# Patient Record
Sex: Male | Born: 1945
Health system: Southern US, Community
[De-identification: ages and names within clinical notes are randomized; demographics above are authoritative.]

## PROBLEM LIST (undated history)

## (undated) DIAGNOSIS — J449 Chronic obstructive pulmonary disease, unspecified: Secondary | ICD-10-CM

## (undated) DIAGNOSIS — K5010 Crohn's disease of large intestine without complications: Secondary | ICD-10-CM

## (undated) DIAGNOSIS — I4891 Unspecified atrial fibrillation: Secondary | ICD-10-CM

## (undated) DIAGNOSIS — I10 Essential (primary) hypertension: Secondary | ICD-10-CM

## (undated) DIAGNOSIS — G4733 Obstructive sleep apnea (adult) (pediatric): Secondary | ICD-10-CM

## (undated) DIAGNOSIS — E119 Type 2 diabetes mellitus without complications: Secondary | ICD-10-CM

## (undated) DIAGNOSIS — C3490 Malignant neoplasm of unspecified part of unspecified bronchus or lung: Secondary | ICD-10-CM

## (undated) HISTORY — PX: HEMORRHOID SURGERY: SHX153

---

## 2016-10-30 DIAGNOSIS — I10 Essential (primary) hypertension: Secondary | ICD-10-CM | POA: Diagnosis not present

## 2016-10-30 DIAGNOSIS — J9621 Acute and chronic respiratory failure with hypoxia: Secondary | ICD-10-CM | POA: Diagnosis not present

## 2016-10-30 DIAGNOSIS — J449 Chronic obstructive pulmonary disease, unspecified: Secondary | ICD-10-CM | POA: Diagnosis not present

## 2016-10-30 DIAGNOSIS — Z9889 Other specified postprocedural states: Secondary | ICD-10-CM | POA: Diagnosis not present

## 2016-10-30 DIAGNOSIS — C3490 Malignant neoplasm of unspecified part of unspecified bronchus or lung: Secondary | ICD-10-CM | POA: Diagnosis not present

## 2016-10-30 DIAGNOSIS — E875 Hyperkalemia: Secondary | ICD-10-CM | POA: Diagnosis not present

## 2016-10-30 DIAGNOSIS — I4891 Unspecified atrial fibrillation: Secondary | ICD-10-CM | POA: Diagnosis not present

## 2016-10-30 DIAGNOSIS — D72829 Elevated white blood cell count, unspecified: Secondary | ICD-10-CM | POA: Diagnosis not present

## 2016-10-31 DIAGNOSIS — D72829 Elevated white blood cell count, unspecified: Secondary | ICD-10-CM | POA: Diagnosis not present

## 2016-10-31 DIAGNOSIS — Z9889 Other specified postprocedural states: Secondary | ICD-10-CM | POA: Diagnosis not present

## 2016-10-31 DIAGNOSIS — I10 Essential (primary) hypertension: Secondary | ICD-10-CM | POA: Diagnosis not present

## 2016-10-31 DIAGNOSIS — I4891 Unspecified atrial fibrillation: Secondary | ICD-10-CM | POA: Diagnosis not present

## 2016-10-31 DIAGNOSIS — J449 Chronic obstructive pulmonary disease, unspecified: Secondary | ICD-10-CM | POA: Diagnosis not present

## 2016-10-31 DIAGNOSIS — J9621 Acute and chronic respiratory failure with hypoxia: Secondary | ICD-10-CM | POA: Diagnosis not present

## 2016-10-31 DIAGNOSIS — C3490 Malignant neoplasm of unspecified part of unspecified bronchus or lung: Secondary | ICD-10-CM | POA: Diagnosis not present

## 2016-10-31 DIAGNOSIS — E875 Hyperkalemia: Secondary | ICD-10-CM | POA: Diagnosis not present

## 2017-09-27 DIAGNOSIS — G4700 Insomnia, unspecified: Secondary | ICD-10-CM | POA: Diagnosis not present

## 2017-09-27 DIAGNOSIS — I10 Essential (primary) hypertension: Secondary | ICD-10-CM | POA: Diagnosis not present

## 2017-09-30 DIAGNOSIS — G4700 Insomnia, unspecified: Secondary | ICD-10-CM | POA: Diagnosis not present

## 2017-09-30 DIAGNOSIS — Z6836 Body mass index (BMI) 36.0-36.9, adult: Secondary | ICD-10-CM | POA: Diagnosis not present

## 2017-10-07 DIAGNOSIS — Z6834 Body mass index (BMI) 34.0-34.9, adult: Secondary | ICD-10-CM | POA: Diagnosis not present

## 2017-10-07 DIAGNOSIS — Z7189 Other specified counseling: Secondary | ICD-10-CM | POA: Diagnosis not present

## 2017-12-17 DIAGNOSIS — D2310 Other benign neoplasm of skin of unspecified eyelid, including canthus: Secondary | ICD-10-CM | POA: Diagnosis not present

## 2018-01-22 DIAGNOSIS — Z6835 Body mass index (BMI) 35.0-35.9, adult: Secondary | ICD-10-CM | POA: Diagnosis not present

## 2018-01-22 DIAGNOSIS — G4700 Insomnia, unspecified: Secondary | ICD-10-CM | POA: Diagnosis not present

## 2018-01-22 DIAGNOSIS — K644 Residual hemorrhoidal skin tags: Secondary | ICD-10-CM | POA: Diagnosis not present

## 2018-03-25 DIAGNOSIS — Z923 Personal history of irradiation: Secondary | ICD-10-CM | POA: Diagnosis not present

## 2018-03-25 DIAGNOSIS — Z1211 Encounter for screening for malignant neoplasm of colon: Secondary | ICD-10-CM | POA: Diagnosis not present

## 2018-03-25 DIAGNOSIS — D123 Benign neoplasm of transverse colon: Secondary | ICD-10-CM | POA: Diagnosis not present

## 2018-03-25 DIAGNOSIS — K529 Noninfective gastroenteritis and colitis, unspecified: Secondary | ICD-10-CM | POA: Diagnosis not present

## 2018-03-25 DIAGNOSIS — Z7901 Long term (current) use of anticoagulants: Secondary | ICD-10-CM | POA: Diagnosis not present

## 2018-03-25 DIAGNOSIS — K6289 Other specified diseases of anus and rectum: Secondary | ICD-10-CM | POA: Diagnosis not present

## 2018-03-25 DIAGNOSIS — K210 Gastro-esophageal reflux disease with esophagitis: Secondary | ICD-10-CM | POA: Diagnosis not present

## 2018-03-25 DIAGNOSIS — I4891 Unspecified atrial fibrillation: Secondary | ICD-10-CM | POA: Diagnosis not present

## 2018-03-25 DIAGNOSIS — K222 Esophageal obstruction: Secondary | ICD-10-CM | POA: Diagnosis not present

## 2018-03-25 DIAGNOSIS — K449 Diaphragmatic hernia without obstruction or gangrene: Secondary | ICD-10-CM | POA: Diagnosis not present

## 2018-03-25 DIAGNOSIS — D124 Benign neoplasm of descending colon: Secondary | ICD-10-CM | POA: Diagnosis not present

## 2018-03-25 DIAGNOSIS — Z7951 Long term (current) use of inhaled steroids: Secondary | ICD-10-CM | POA: Diagnosis not present

## 2018-03-25 DIAGNOSIS — D125 Benign neoplasm of sigmoid colon: Secondary | ICD-10-CM | POA: Diagnosis not present

## 2018-03-25 DIAGNOSIS — Z85118 Personal history of other malignant neoplasm of bronchus and lung: Secondary | ICD-10-CM | POA: Diagnosis not present

## 2018-03-25 DIAGNOSIS — K2970 Gastritis, unspecified, without bleeding: Secondary | ICD-10-CM | POA: Diagnosis not present

## 2018-03-25 DIAGNOSIS — J449 Chronic obstructive pulmonary disease, unspecified: Secondary | ICD-10-CM | POA: Diagnosis not present

## 2018-03-25 DIAGNOSIS — K2950 Unspecified chronic gastritis without bleeding: Secondary | ICD-10-CM | POA: Diagnosis not present

## 2018-03-25 DIAGNOSIS — E1140 Type 2 diabetes mellitus with diabetic neuropathy, unspecified: Secondary | ICD-10-CM | POA: Diagnosis not present

## 2018-03-25 DIAGNOSIS — Z7982 Long term (current) use of aspirin: Secondary | ICD-10-CM | POA: Diagnosis not present

## 2018-03-25 DIAGNOSIS — K648 Other hemorrhoids: Secondary | ICD-10-CM | POA: Diagnosis not present

## 2018-03-25 DIAGNOSIS — K635 Polyp of colon: Secondary | ICD-10-CM | POA: Diagnosis not present

## 2018-03-25 DIAGNOSIS — K644 Residual hemorrhoidal skin tags: Secondary | ICD-10-CM | POA: Diagnosis not present

## 2018-03-25 DIAGNOSIS — Z79899 Other long term (current) drug therapy: Secondary | ICD-10-CM | POA: Diagnosis not present

## 2018-03-25 DIAGNOSIS — D122 Benign neoplasm of ascending colon: Secondary | ICD-10-CM | POA: Diagnosis not present

## 2018-04-08 DIAGNOSIS — J449 Chronic obstructive pulmonary disease, unspecified: Secondary | ICD-10-CM | POA: Diagnosis not present

## 2018-04-08 DIAGNOSIS — E119 Type 2 diabetes mellitus without complications: Secondary | ICD-10-CM | POA: Diagnosis not present

## 2018-04-08 DIAGNOSIS — I10 Essential (primary) hypertension: Secondary | ICD-10-CM | POA: Diagnosis not present

## 2018-04-08 DIAGNOSIS — K643 Fourth degree hemorrhoids: Secondary | ICD-10-CM | POA: Diagnosis not present

## 2018-04-11 DIAGNOSIS — Z01812 Encounter for preprocedural laboratory examination: Secondary | ICD-10-CM | POA: Diagnosis not present

## 2018-04-11 DIAGNOSIS — I4510 Unspecified right bundle-branch block: Secondary | ICD-10-CM | POA: Diagnosis not present

## 2018-04-11 DIAGNOSIS — K643 Fourth degree hemorrhoids: Secondary | ICD-10-CM | POA: Diagnosis not present

## 2018-04-11 DIAGNOSIS — I4519 Other right bundle-branch block: Secondary | ICD-10-CM | POA: Diagnosis not present

## 2018-04-11 DIAGNOSIS — I4891 Unspecified atrial fibrillation: Secondary | ICD-10-CM | POA: Diagnosis not present

## 2018-04-11 DIAGNOSIS — Z01810 Encounter for preprocedural cardiovascular examination: Secondary | ICD-10-CM | POA: Diagnosis not present

## 2018-04-14 DIAGNOSIS — K643 Fourth degree hemorrhoids: Secondary | ICD-10-CM | POA: Diagnosis not present

## 2018-04-14 DIAGNOSIS — K649 Unspecified hemorrhoids: Secondary | ICD-10-CM | POA: Diagnosis not present

## 2018-04-14 DIAGNOSIS — J449 Chronic obstructive pulmonary disease, unspecified: Secondary | ICD-10-CM | POA: Diagnosis not present

## 2018-04-14 DIAGNOSIS — Z9119 Patient's noncompliance with other medical treatment and regimen: Secondary | ICD-10-CM | POA: Diagnosis not present

## 2018-04-14 DIAGNOSIS — I4519 Other right bundle-branch block: Secondary | ICD-10-CM | POA: Diagnosis not present

## 2018-04-14 DIAGNOSIS — Z87891 Personal history of nicotine dependence: Secondary | ICD-10-CM | POA: Diagnosis not present

## 2018-04-14 DIAGNOSIS — I4891 Unspecified atrial fibrillation: Secondary | ICD-10-CM | POA: Diagnosis not present

## 2018-04-14 DIAGNOSIS — E119 Type 2 diabetes mellitus without complications: Secondary | ICD-10-CM | POA: Diagnosis not present

## 2018-04-14 DIAGNOSIS — I10 Essential (primary) hypertension: Secondary | ICD-10-CM | POA: Diagnosis not present

## 2018-04-14 DIAGNOSIS — K648 Other hemorrhoids: Secondary | ICD-10-CM | POA: Diagnosis not present

## 2018-04-14 DIAGNOSIS — G4733 Obstructive sleep apnea (adult) (pediatric): Secondary | ICD-10-CM | POA: Diagnosis not present

## 2018-04-14 DIAGNOSIS — F419 Anxiety disorder, unspecified: Secondary | ICD-10-CM | POA: Diagnosis not present

## 2018-07-18 DIAGNOSIS — G9009 Other idiopathic peripheral autonomic neuropathy: Secondary | ICD-10-CM | POA: Diagnosis not present

## 2018-10-02 DIAGNOSIS — H11002 Unspecified pterygium of left eye: Secondary | ICD-10-CM | POA: Diagnosis not present

## 2018-10-02 DIAGNOSIS — L820 Inflamed seborrheic keratosis: Secondary | ICD-10-CM | POA: Diagnosis not present

## 2018-10-02 DIAGNOSIS — D2339 Other benign neoplasm of skin of other parts of face: Secondary | ICD-10-CM | POA: Diagnosis not present

## 2018-10-02 DIAGNOSIS — D485 Neoplasm of uncertain behavior of skin: Secondary | ICD-10-CM | POA: Diagnosis not present

## 2019-04-02 ENCOUNTER — Inpatient Hospital Stay (HOSPITAL_COMMUNITY): Payer: No Typology Code available for payment source

## 2019-04-02 ENCOUNTER — Inpatient Hospital Stay (HOSPITAL_COMMUNITY)
Admission: EM | Admit: 2019-04-02 | Discharge: 2019-04-05 | DRG: 208 | Disposition: A | Discharge status: Home Health | Payer: No Typology Code available for payment source | Source: Other Acute Inpatient Hospital | Attending: Internal Medicine | Admitting: Internal Medicine

## 2019-04-02 ENCOUNTER — Encounter (HOSPITAL_COMMUNITY): Payer: Self-pay | Admitting: Internal Medicine

## 2019-04-02 DIAGNOSIS — I4891 Unspecified atrial fibrillation: Secondary | ICD-10-CM | POA: Diagnosis present

## 2019-04-02 DIAGNOSIS — Z833 Family history of diabetes mellitus: Secondary | ICD-10-CM

## 2019-04-02 DIAGNOSIS — Z7982 Long term (current) use of aspirin: Secondary | ICD-10-CM | POA: Diagnosis not present

## 2019-04-02 DIAGNOSIS — Z20828 Contact with and (suspected) exposure to other viral communicable diseases: Secondary | ICD-10-CM | POA: Diagnosis not present

## 2019-04-02 DIAGNOSIS — J449 Chronic obstructive pulmonary disease, unspecified: Secondary | ICD-10-CM | POA: Diagnosis present

## 2019-04-02 DIAGNOSIS — K5010 Crohn's disease of large intestine without complications: Secondary | ICD-10-CM | POA: Diagnosis not present

## 2019-04-02 DIAGNOSIS — F1021 Alcohol dependence, in remission: Secondary | ICD-10-CM | POA: Diagnosis present

## 2019-04-02 DIAGNOSIS — J441 Chronic obstructive pulmonary disease with (acute) exacerbation: Secondary | ICD-10-CM | POA: Diagnosis not present

## 2019-04-02 DIAGNOSIS — J9621 Acute and chronic respiratory failure with hypoxia: Principal | ICD-10-CM | POA: Diagnosis present

## 2019-04-02 DIAGNOSIS — J9622 Acute and chronic respiratory failure with hypercapnia: Secondary | ICD-10-CM | POA: Diagnosis not present

## 2019-04-02 DIAGNOSIS — Z87891 Personal history of nicotine dependence: Secondary | ICD-10-CM

## 2019-04-02 DIAGNOSIS — J9690 Respiratory failure, unspecified, unspecified whether with hypoxia or hypercapnia: Secondary | ICD-10-CM | POA: Diagnosis not present

## 2019-04-02 DIAGNOSIS — K50119 Crohn's disease of large intestine with unspecified complications: Secondary | ICD-10-CM | POA: Diagnosis not present

## 2019-04-02 DIAGNOSIS — Z8049 Family history of malignant neoplasm of other genital organs: Secondary | ICD-10-CM

## 2019-04-02 DIAGNOSIS — Z9221 Personal history of antineoplastic chemotherapy: Secondary | ICD-10-CM

## 2019-04-02 DIAGNOSIS — E662 Morbid (severe) obesity with alveolar hypoventilation: Secondary | ICD-10-CM | POA: Diagnosis not present

## 2019-04-02 DIAGNOSIS — Z9981 Dependence on supplemental oxygen: Secondary | ICD-10-CM | POA: Diagnosis not present

## 2019-04-02 DIAGNOSIS — T426X5A Adverse effect of other antiepileptic and sedative-hypnotic drugs, initial encounter: Secondary | ICD-10-CM | POA: Diagnosis present

## 2019-04-02 DIAGNOSIS — G9340 Encephalopathy, unspecified: Secondary | ICD-10-CM | POA: Diagnosis not present

## 2019-04-02 DIAGNOSIS — Z85118 Personal history of other malignant neoplasm of bronchus and lung: Secondary | ICD-10-CM

## 2019-04-02 DIAGNOSIS — J9611 Chronic respiratory failure with hypoxia: Secondary | ICD-10-CM

## 2019-04-02 DIAGNOSIS — Z923 Personal history of irradiation: Secondary | ICD-10-CM | POA: Diagnosis not present

## 2019-04-02 DIAGNOSIS — Z800 Family history of malignant neoplasm of digestive organs: Secondary | ICD-10-CM

## 2019-04-02 DIAGNOSIS — Z6835 Body mass index (BMI) 35.0-35.9, adult: Secondary | ICD-10-CM | POA: Diagnosis not present

## 2019-04-02 DIAGNOSIS — G4700 Insomnia, unspecified: Secondary | ICD-10-CM | POA: Diagnosis not present

## 2019-04-02 DIAGNOSIS — K219 Gastro-esophageal reflux disease without esophagitis: Secondary | ICD-10-CM | POA: Diagnosis not present

## 2019-04-02 DIAGNOSIS — J9601 Acute respiratory failure with hypoxia: Secondary | ICD-10-CM | POA: Diagnosis not present

## 2019-04-02 DIAGNOSIS — G92 Toxic encephalopathy: Secondary | ICD-10-CM | POA: Diagnosis not present

## 2019-04-02 DIAGNOSIS — I482 Chronic atrial fibrillation, unspecified: Secondary | ICD-10-CM | POA: Diagnosis not present

## 2019-04-02 HISTORY — DX: Chronic obstructive pulmonary disease, unspecified: J44.9

## 2019-04-02 HISTORY — DX: Obstructive sleep apnea (adult) (pediatric): G47.33

## 2019-04-02 LAB — PROCALCITONIN: Procalcitonin: <0.1 ng/mL

## 2019-04-02 LAB — COMPREHENSIVE METABOLIC PANEL
ALT: 22 U/L (ref 0–44)
AST: 23 U/L (ref 15–41)
Albumin: 3.4 g/dL — ABNORMAL LOW (ref 3.5–5.0)
Alkaline Phosphatase: 65 U/L (ref 38–126)
Anion gap: 10 (ref 5–15)
BUN: 19 mg/dL (ref 8–23)
CO2: 36 mmol/L — ABNORMAL HIGH (ref 22–32)
Calcium: 8.9 mg/dL (ref 8.9–10.3)
Chloride: 92 mmol/L — ABNORMAL LOW (ref 98–111)
Creatinine, Ser: 1.01 mg/dL (ref 0.61–1.24)
GFR calc Af Amer: >60 mL/min (ref >60)
GFR calc non Af Amer: >60 mL/min (ref >60)
Glucose, Bld: 159 mg/dL — ABNORMAL HIGH (ref 70–99)
Potassium: 4.3 mmol/L (ref 3.5–5.1)
Sodium: 138 mmol/L (ref 135–145)
Total Bilirubin: 0.7 mg/dL (ref 0.3–1.2)
Total Protein: 6.2 g/dL — ABNORMAL LOW (ref 6.5–8.1)

## 2019-04-02 LAB — STREP PNEUMONIAE URINARY ANTIGEN: Strep Pneumo Urinary Antigen: NEGATIVE

## 2019-04-02 LAB — APTT: aPTT: 29 seconds (ref 24–36)

## 2019-04-02 LAB — CORTISOL: Cortisol, Plasma: 7.8 ug/dL

## 2019-04-02 LAB — AMYLASE: Amylase: 26 U/L — ABNORMAL LOW (ref 28–100)

## 2019-04-02 LAB — GLUCOSE, CAPILLARY
Glucose-Capillary: 150 mg/dL — ABNORMAL HIGH (ref 70–99)
Glucose-Capillary: 163 mg/dL — ABNORMAL HIGH (ref 70–99)

## 2019-04-02 LAB — CBC
HCT: 44.5 % (ref 39.0–52.0)
Hemoglobin: 13.7 g/dL (ref 13.0–17.0)
MCH: 29.1 pg (ref 26.0–34.0)
MCHC: 30.8 g/dL (ref 30.0–36.0)
MCV: 94.7 fL (ref 80.0–100.0)
Platelets: 219 10*3/uL (ref 150–400)
RBC: 4.7 MIL/uL (ref 4.22–5.81)
RDW: 14.3 % (ref 11.5–15.5)
WBC: 8.6 10*3/uL (ref 4.0–10.5)
nRBC: 0 % (ref 0.0–0.2)

## 2019-04-02 LAB — MAGNESIUM: Magnesium: 1.8 mg/dL (ref 1.7–2.4)

## 2019-04-02 LAB — MRSA PCR SCREENING: MRSA by PCR: POSITIVE — AB

## 2019-04-02 LAB — LIPASE, BLOOD: Lipase: 18 U/L (ref 11–51)

## 2019-04-02 LAB — LACTIC ACID, PLASMA
Lactic Acid, Venous: 1.5 mmol/L (ref 0.5–1.9)
Lactic Acid, Venous: 1.5 mmol/L (ref 0.5–1.9)

## 2019-04-02 LAB — PROTIME-INR
INR: 1.2 (ref 0.8–1.2)
Prothrombin Time: 15 seconds (ref 11.4–15.2)

## 2019-04-02 LAB — PHOSPHORUS: Phosphorus: 2.7 mg/dL (ref 2.5–4.6)

## 2019-04-02 LAB — SARS CORONAVIRUS 2 BY RT PCR (HOSPITAL ORDER, PERFORMED IN ~~LOC~~ HOSPITAL LAB): SARS Coronavirus 2: NEGATIVE

## 2019-04-02 MED ORDER — DEXMEDETOMIDINE HCL IN NACL 400 MCG/100ML IV SOLN
0.4000 ug/kg/h | INTRAVENOUS | Status: DC
  Administered 2019-04-02: 0.4 ug/kg/h via INTRAVENOUS
  Administered 2019-04-03: 0.6 ug/kg/h via INTRAVENOUS
  Administered 2019-04-03: 0.8 ug/kg/h via INTRAVENOUS
  Filled 2019-04-02 (×4): qty 100

## 2019-04-02 MED ORDER — PANTOPRAZOLE SODIUM 40 MG IV SOLR
40.0000 mg | Freq: Every day | INTRAVENOUS | Status: DC
  Administered 2019-04-02 – 2019-04-03 (×2): 40 mg via INTRAVENOUS
  Filled 2019-04-02 (×2): qty 40

## 2019-04-02 MED ORDER — CHLORHEXIDINE GLUCONATE 0.12 % MT SOLN
15.0000 mL | Freq: Two times a day (BID) | OROMUCOSAL | Status: DC
  Administered 2019-04-02 – 2019-04-05 (×4): 15 mL via OROMUCOSAL
  Filled 2019-04-02: qty 15

## 2019-04-02 MED ORDER — LORAZEPAM 2 MG/ML IJ SOLN
1.0000 mg | Freq: Once | INTRAMUSCULAR | Status: AC
  Administered 2019-04-02: 1 mg via INTRAVENOUS
  Filled 2019-04-02: qty 1

## 2019-04-02 MED ORDER — IPRATROPIUM-ALBUTEROL 0.5-2.5 (3) MG/3ML IN SOLN
3.0000 mL | Freq: Four times a day (QID) | RESPIRATORY_TRACT | Status: DC
  Administered 2019-04-02 – 2019-04-05 (×13): 3 mL via RESPIRATORY_TRACT
  Filled 2019-04-02 (×13): qty 3

## 2019-04-02 MED ORDER — LABETALOL HCL 5 MG/ML IV SOLN: 10.0000 mg | Freq: Once | INTRAVENOUS | Status: DC

## 2019-04-02 MED ORDER — ORAL CARE MOUTH RINSE
15.0000 mL | OROMUCOSAL | Status: DC
  Administered 2019-04-02 (×2): 15 mL via OROMUCOSAL

## 2019-04-02 MED ORDER — SODIUM CHLORIDE 0.9 % IV SOLN
INTRAVENOUS | Status: DC
  Administered 2019-04-02: 14:00:00 via INTRAVENOUS

## 2019-04-02 MED ORDER — ENOXAPARIN SODIUM 40 MG/0.4ML ~~LOC~~ SOLN
40.0000 mg | SUBCUTANEOUS | Status: DC
  Administered 2019-04-02: 40 mg via SUBCUTANEOUS
  Filled 2019-04-02: qty 0.4

## 2019-04-02 MED ORDER — CHLORHEXIDINE GLUCONATE 0.12% ORAL RINSE (MEDLINE KIT)
15.0000 mL | Freq: Two times a day (BID) | OROMUCOSAL | Status: DC
  Administered 2019-04-02: 15 mL via OROMUCOSAL

## 2019-04-02 MED ORDER — ORAL CARE MOUTH RINSE
15.0000 mL | Freq: Two times a day (BID) | OROMUCOSAL | Status: DC
  Administered 2019-04-02 – 2019-04-04 (×3): 15 mL via OROMUCOSAL

## 2019-04-02 NOTE — Progress Notes (Signed)
219ml of fentanyl from bag and 48ml of versed from bag wasted in stericycle. Virl Diamond, RN witnessed.

## 2019-04-02 NOTE — Progress Notes (Signed)
Pt extremely agitated, attempting to get out of bed, tachypnea in 40s, tachycardic in 130s. Relayed to CCM MD. Received verbal orders from Dr. Tamala Julian for 1x dose of 1mg  ativan, and to start precedex gtt if agitation continues.

## 2019-04-02 NOTE — Procedures (Signed)
Patient extubated, no issues.  Remains confused but able to state name.  Strong cough.

## 2019-04-02 NOTE — Progress Notes (Signed)
Spoke w/ pts dtr Heather to provide updates on pt. Reviewed visitation policies. Nira Conn states understanding, appreciative.

## 2019-04-02 NOTE — Procedures (Signed)
Extubation Procedure Note  Patient Details:   Name: Eddie Rivera DOB: [birth year] MRN: [id number]   Airway Documentation:    Vent end date: 04/02/19 Vent end time: 1330   Evaluation  O2 sats: stable throughout Complications: No apparent complications Bilateral Breath Sounds: Diminished   RN called RT to state that Dr. Tamala Julian had  extubated pt. Order for BiPAP after extubation. Pt stable at this time and VSS. RN at bedside.  Rossie Muskrat R 04/02/2019, 1330
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
## 2019-04-03 LAB — CBC
HCT: 45.2 % (ref 39.0–52.0)
Hemoglobin: 14.1 g/dL (ref 13.0–17.0)
MCH: 29.2 pg (ref 26.0–34.0)
MCHC: 31.2 g/dL (ref 30.0–36.0)
MCV: 93.6 fL (ref 80.0–100.0)
Platelets: 234 10*3/uL (ref 150–400)
RBC: 4.83 MIL/uL (ref 4.22–5.81)
RDW: 14.9 % (ref 11.5–15.5)
WBC: 11.3 10*3/uL — ABNORMAL HIGH (ref 4.0–10.5)
nRBC: 0 % (ref 0.0–0.2)

## 2019-04-03 LAB — POCT I-STAT 7, (LYTES, BLD GAS, ICA,H+H)
Acid-Base Excess: 12 mmol/L — ABNORMAL HIGH (ref 0.0–2.0)
Bicarbonate: 38.3 mmol/L — ABNORMAL HIGH (ref 20.0–28.0)
Calcium, Ion: 1.18 mmol/L (ref 1.15–1.40)
HCT: 46 % (ref 39.0–52.0)
Hemoglobin: 15.6 g/dL (ref 13.0–17.0)
O2 Saturation: 94 %
Patient temperature: 97.7
Potassium: 4.1 mmol/L (ref 3.5–5.1)
Sodium: 135 mmol/L (ref 135–145)
TCO2: 40 mmol/L — ABNORMAL HIGH (ref 22–32)
pCO2 arterial: 51.4 mmHg — ABNORMAL HIGH (ref 32.0–48.0)
pH, Arterial: 7.478 — ABNORMAL HIGH (ref 7.350–7.450)
pO2, Arterial: 64 mmHg — ABNORMAL LOW (ref 83.0–108.0)

## 2019-04-03 LAB — URINE CULTURE: Culture: NO GROWTH

## 2019-04-03 LAB — GLUCOSE, CAPILLARY
Glucose-Capillary: 151 mg/dL — ABNORMAL HIGH (ref 70–99)
Glucose-Capillary: 153 mg/dL — ABNORMAL HIGH (ref 70–99)
Glucose-Capillary: 121 mg/dL — ABNORMAL HIGH (ref 70–99)
Glucose-Capillary: 90 mg/dL (ref 70–99)
Glucose-Capillary: 119 mg/dL — ABNORMAL HIGH (ref 70–99)

## 2019-04-03 LAB — BASIC METABOLIC PANEL
Anion gap: 12 (ref 5–15)
BUN: 22 mg/dL (ref 8–23)
CO2: 32 mmol/L (ref 22–32)
Calcium: 8.8 mg/dL — ABNORMAL LOW (ref 8.9–10.3)
Chloride: 94 mmol/L — ABNORMAL LOW (ref 98–111)
Creatinine, Ser: 1.04 mg/dL (ref 0.61–1.24)
GFR calc Af Amer: >60 mL/min (ref >60)
GFR calc non Af Amer: >60 mL/min (ref >60)
Glucose, Bld: 155 mg/dL — ABNORMAL HIGH (ref 70–99)
Potassium: 4.5 mmol/L (ref 3.5–5.1)
Sodium: 138 mmol/L (ref 135–145)

## 2019-04-03 LAB — AMMONIA: Ammonia: 28 umol/L (ref 9–35)

## 2019-04-03 MED ORDER — NON FORMULARY: 3.0000 mg | Freq: Every evening | Status: DC | PRN

## 2019-04-03 MED ORDER — MELATONIN 3 MG PO TABS
3.0000 mg | ORAL_TABLET | Freq: Every evening | ORAL | Status: DC | PRN
  Administered 2019-04-03 – 2019-04-04 (×2): 3 mg via ORAL
  Filled 2019-04-03 (×3): qty 1

## 2019-04-03 MED ORDER — NON FORMULARY: Freq: Every evening | Status: DC | PRN

## 2019-04-03 MED ORDER — CHLORHEXIDINE GLUCONATE CLOTH 2 % EX PADS
6.0000 | MEDICATED_PAD | Freq: Every day | CUTANEOUS | Status: DC
  Administered 2019-04-04: 6 via TOPICAL

## 2019-04-03 NOTE — H&P (Signed)
NAME:  Eddie Rivera, MRN:  [id number], DOB:  [birth year], LOS: 1 ADMISSION DATE:  04/02/2019, CONSULTATION DATE:  04/02/19 REFERRING MD:  Eddie Rivera ER, CHIEF COMPLAINT:  AMS   Brief History   73 year old man with hx of O2-dependent COPD, obesity presenting with AMS culminating in intubation.  History of present illness   History per chart review as patient is sedated and intubated.  73 year old man with hx of O2-dependent COPD.  73 year old male history of HIV elation syndrome he was noted to have been using [name] came up tired and was intubated at outside hospital transferred to [facility] for further evaluation and treatment.  He does have a past medical history significant for lung cancer 2014 [facility].  He is an ex-smoker.  Stop date is unknown.  Plan is to rapidly wean as tolerated hold sedation.       Significant Hospital Events   04/02/19 admitted and extubated  Consults:  NA  Procedures:  04/02/2019 intubation at [facility]  Significant Diagnostic Tests:    Micro Data:  04/02/2019 sputum>> 04/02/2019 blood culture>>  Antimicrobials:  None  Interim history/subjective:  Extubated yesterday, intermittent agitation requiring precedex initiation  Objective   Blood pressure 93/64, pulse (!) 47, temperature (!) 96.5 F (35.8 C), temperature source Axillary, resp. rate 20, height 6\' 4"  (1.93 m), weight 119.8 kg, SpO2 97 %.    Vent Mode: BIPAP FiO2 (%):  [40 %-100 %] 40 % Set Rate:  [10 bmp-20 bmp] 18 bmp Vt Set:  [450 mL-690 mL] 520 mL PEEP:  [5 cmH20-8 cmH20] 6 cmH20 Pressure Support:  [12 cmH20] 12 cmH20 Plateau Pressure:  [18 cmH20-29 cmH20] 29 cmH20   Intake/Output Summary (Last 24 hours) at 04/03/2019 0744 Last data filed at 04/03/2019 0600 Gross per 24 hour  Intake 1028.41 ml  Output 1524 ml  Net -495.59 ml   Filed Weights   04/02/19 1815 04/03/19 0150  Weight: 122.4 kg 119.8 kg     Examination: GEN: obese man in NAD on BIPAP HEENT: BIPAP In place with good seal CV: Irregular, ext warm PULM: Occasional crackles at bases, no accessory muscle use, Cheyne-Stokes breathing pattern GI: Soft, +BS EXT: Trace edema NEURO: Moves ext to command with repeated prompting PSYCH: Too sedate to get anything out of him this AM SKIN: No rashes  Labs show mild leukocytosis Chemistries benign Procal neg  Resolved Hospital Problem list     Assessment & Plan:  # Acute respiratory failure- related to encephalopathy and poor inspiratory effort, stable on BIPAP, ABG looks okay/compensated.  There is a question of taking too much of his mother's ambien after she passed away a few weeks ago. # Underlying OHS/COPD- result in severe chronic hypercapnea # Toxic metabolic encephalopathy- suspicion related to PTA meds, should r/o subclinical status, will try to wean off precedex today.  Exam nonfocal. # Prior lung ca- tx with either resection or targeted chemoradiation, a little unclear, regardless CXR only showing R hemidiaphragm elevation # Afib- not on [facility] for unclear reason, will try to figure out from family  - Wean precedex - Try on [place] - See if can pass swallow screen - EEG, ammonia check - PTA nebs - BIPAP at night and PRN - I am hopeful with time he will return to baseline, if not later this afternoon will probably pursue CT head although his neuro exam is nonfocal  Best practice:  Diet: N.p.o. for now,  if does not pass swallow will need cortrak Pain/Anxiety/Delirium protocol (if indicated): Precedex, try to wean VAP protocol (if indicated): Not indicated DVT prophylaxis: Lovenox GI prophylaxis: PPI Glucose control: Sliding scale insulin protocol Mobility: Bedrest for now Code Status: Full Family Communication: will reach out Disposition: ICU pending delirium improvement and liberation from precedex   The patient is critically ill with multiple organ systems failure and  requires high complexity decision making for assessment and support, frequent evaluation and titration of therapies, application of advanced monitoring technologies and extensive interpretation of multiple databases. Critical Care Time devoted to patient care services described in this note independent of APP/resident time (if applicable)  is 31 minutes.   Eddie Emery MD [facility] 04/03/19 7:55 AM Personal pager: [phone] If unanswered, please page CCM On-call: [phone]

## 2019-04-03 NOTE — Progress Notes (Signed)
Much more awake now No need for EEG Had not taken ambien prior to this event Will have PT see him and work on progressive mobility. Check AM CXR. IS, flutter Appreciate TRH taking over care starting tomorrow

## 2019-04-03 NOTE — Progress Notes (Signed)
eLink Physician-Brief Progress Note Patient Name: Eddie Rivera DOB: [birth year] MRN: [id number]   Date of Service  04/03/2019  HPI/Events of Note  Pt is asking for something for insomnia, he has COPD.  eICU Interventions  Melatonin 3 mg hs prn        Frederik Pear 04/03/2019, 10:43 PM

## 2019-04-03 NOTE — Evaluation (Signed)
Physical Therapy Evaluation Patient Details Name: Eddie Rivera MRN: [id number] DOB: [birth year] Today's Date: 04/03/2019   History of Present Illness  Pt adm with AMS resulting in respiratory failure requiring short term intubation. Pt had taken too much ambien. PMH - O2 dependent copd, obesity, lung ca  Clinical Impression  Pt presents to PT with decr in mobility from baseline. Expect he will make good progress and be able to return home with daughter. May initially need some more support from daughter at home.     Follow Up Recommendations Home health PT;Supervision for mobility/OOB    Equipment Recommendations  None recommended by PT    Recommendations for Other Services       Precautions / Restrictions Precautions Precautions: Fall      Mobility  Bed Mobility Overal bed mobility: Needs Assistance Bed Mobility: Supine to Sit     Supine to sit: Min assist     General bed mobility comments: Assist to elevate trunk into sitting. Use of rail  Transfers Overall transfer level: Needs assistance Equipment used: Rolling walker (2 wheeled) Transfers: Sit to/from Stand Sit to Stand: Min assist         General transfer comment: assist for balance  Ambulation/Gait Ambulation/Gait assistance: Min assist Gait Distance (Feet): 3 Feet Assistive device: Rolling walker (2 wheeled) Gait Pattern/deviations: Step-through pattern;Decreased stride length;Trunk flexed;Wide base of support Gait velocity: decr Gait velocity interpretation: <1.31 ft/sec, indicative of household ambulator General Gait Details: Assist for balance. Pt on 4L of O2.   Stairs            Wheelchair Mobility    Modified Rankin (Stroke Patients Only)       Balance Overall balance assessment: Needs assistance Sitting-balance support: No upper extremity supported;Feet supported Sitting balance-Leahy Scale: Good     Standing balance support: Bilateral upper extremity supported Standing  balance-Leahy Scale: Poor Standing balance comment: walker and supervision for static standing                             Pertinent Vitals/Pain Pain Assessment: No/denies pain    Home Living Family/patient expects to be discharged to:: Private residence Living Arrangements: Alone Available Help at Discharge: Family;Available PRN/intermittently Type of Home: House Home Access: Level entry     Home Layout: One level Home Equipment: Cane - single point;Walker - 2 wheels      Prior Function Level of Independence: Independent with assistive device(s)         Comments: Uses cane     Hand Dominance   Dominant Hand: Left    Extremity/Trunk Assessment   Upper Extremity Assessment Upper Extremity Assessment: Generalized weakness    Lower Extremity Assessment Lower Extremity Assessment: Generalized weakness       Communication   Communication: No difficulties  Cognition Arousal/Alertness: Awake/alert Behavior During Therapy: WFL for tasks assessed/performed Overall Cognitive Status: Impaired/Different from baseline Area of Impairment: Problem solving                             Problem Solving: Slow processing        General Comments      Exercises     Assessment/Plan    PT Assessment Patient needs continued PT services  PT Problem List Decreased strength;Decreased activity tolerance;Decreased balance;Decreased mobility       PT Treatment Interventions DME instruction;Gait training;Functional mobility training;Therapeutic activities;Stair training;Therapeutic exercise;Balance training;Patient/family education  PT Goals (Current goals can be found in the Care Plan section)  Acute Rehab PT Goals Patient Stated Goal: return home PT Goal Formulation: With patient Time For Goal Achievement: 04/17/19 Potential to Achieve Goals: Good    Frequency Min 3X/week   Barriers to discharge Decreased caregiver support family works     Co-evaluation               AM-PAC PT "6 Clicks" Mobility  Outcome Measure Help needed turning from your back to your side while in a flat bed without using bedrails?: A Little Help needed moving from lying on your back to sitting on the side of a flat bed without using bedrails?: A Little Help needed moving to and from a bed to a chair (including a wheelchair)?: A Little Help needed standing up from a chair using your arms (e.g., wheelchair or bedside chair)?: A Little Help needed to walk in hospital room?: A Little Help needed climbing 3-5 steps with a railing? : A Lot 6 Click Score: 17    End of Session Equipment Utilized During Treatment: Gait belt;Oxygen Activity Tolerance: Patient tolerated treatment well Patient left: in chair;with call bell/phone within reach Nurse Communication: Mobility status PT Visit Diagnosis: Unsteadiness on feet (R26.81);Muscle weakness (generalized) (M62.81)    Time: 3244-0102 PT Time Calculation (min) (ACUTE ONLY): 27 min   Charges:   PT Evaluation $PT Eval Moderate Complexity: 1 Mod PT Treatments $Therapeutic Activity: 8-22 mins        [place] Pager [phone] Office [place] 04/03/2019, 4:43 PM

## 2019-04-04 ENCOUNTER — Other Ambulatory Visit: Payer: Self-pay

## 2019-04-04 DIAGNOSIS — J9622 Acute and chronic respiratory failure with hypercapnia: Secondary | ICD-10-CM

## 2019-04-04 DIAGNOSIS — K5010 Crohn's disease of large intestine without complications: Secondary | ICD-10-CM | POA: Diagnosis present

## 2019-04-04 DIAGNOSIS — J9621 Acute and chronic respiratory failure with hypoxia: Principal | ICD-10-CM

## 2019-04-04 DIAGNOSIS — I4891 Unspecified atrial fibrillation: Secondary | ICD-10-CM | POA: Clinically undetermined

## 2019-04-04 DIAGNOSIS — J449 Chronic obstructive pulmonary disease, unspecified: Secondary | ICD-10-CM

## 2019-04-04 LAB — CBC
HCT: 50.4 % (ref 39.0–52.0)
Hemoglobin: 14.8 g/dL (ref 13.0–17.0)
MCH: 29.2 pg (ref 26.0–34.0)
MCHC: 29.4 g/dL — ABNORMAL LOW (ref 30.0–36.0)
MCV: 99.6 fL (ref 80.0–100.0)
Platelets: 220 10*3/uL (ref 150–400)
RBC: 5.06 MIL/uL (ref 4.22–5.81)
RDW: 15.5 % (ref 11.5–15.5)
WBC: 12.6 10*3/uL — ABNORMAL HIGH (ref 4.0–10.5)
nRBC: 0 % (ref 0.0–0.2)

## 2019-04-04 LAB — BASIC METABOLIC PANEL
Anion gap: 13 (ref 5–15)
BUN: 23 mg/dL (ref 8–23)
CO2: 30 mmol/L (ref 22–32)
Calcium: 8.7 mg/dL — ABNORMAL LOW (ref 8.9–10.3)
Chloride: 97 mmol/L — ABNORMAL LOW (ref 98–111)
Creatinine, Ser: 0.95 mg/dL (ref 0.61–1.24)
GFR calc Af Amer: >60 mL/min (ref >60)
GFR calc non Af Amer: >60 mL/min (ref >60)
Glucose, Bld: 97 mg/dL (ref 70–99)
Potassium: 4.3 mmol/L (ref 3.5–5.1)
Sodium: 140 mmol/L (ref 135–145)

## 2019-04-04 LAB — TROPONIN I (HIGH SENSITIVITY)
Troponin I (High Sensitivity): 9 ng/L (ref <18)
Troponin I (High Sensitivity): 9 ng/L (ref <18)

## 2019-04-04 MED ORDER — LOPERAMIDE HCL 2 MG PO CAPS: 4.0000 mg | ORAL_CAPSULE | Freq: Two times a day (BID) | ORAL | Status: DC | PRN

## 2019-04-04 MED ORDER — GABAPENTIN 400 MG PO CAPS
400.0000 mg | ORAL_CAPSULE | Freq: Two times a day (BID) | ORAL | Status: DC
  Administered 2019-04-04 – 2019-04-05 (×3): 400 mg via ORAL
  Filled 2019-04-04 (×3): qty 1

## 2019-04-04 MED ORDER — FLUTICASONE PROPIONATE 50 MCG/ACT NA SUSP
2.0000 | Freq: Every day | NASAL | Status: DC
  Administered 2019-04-04 – 2019-04-05 (×2): 2 via NASAL
  Filled 2019-04-04: qty 16

## 2019-04-04 MED ORDER — CARVEDILOL 12.5 MG PO TABS
12.5000 mg | ORAL_TABLET | Freq: Two times a day (BID) | ORAL | Status: DC
  Administered 2019-04-04 – 2019-04-05 (×2): 12.5 mg via ORAL
  Filled 2019-04-04 (×2): qty 1

## 2019-04-04 MED ORDER — ALBUTEROL SULFATE (2.5 MG/3ML) 0.083% IN NEBU: 3.0000 mL | INHALATION_SOLUTION | Freq: Four times a day (QID) | RESPIRATORY_TRACT | Status: DC | PRN

## 2019-04-04 MED ORDER — PANTOPRAZOLE SODIUM 40 MG PO TBEC
40.0000 mg | DELAYED_RELEASE_TABLET | Freq: Every day | ORAL | Status: DC
  Administered 2019-04-04 – 2019-04-05 (×2): 40 mg via ORAL
  Filled 2019-04-04 (×2): qty 1

## 2019-04-04 MED ORDER — MOMETASONE FURO-FORMOTEROL FUM 200-5 MCG/ACT IN AERO
2.0000 | INHALATION_SPRAY | Freq: Two times a day (BID) | RESPIRATORY_TRACT | Status: DC
  Filled 2019-04-04: qty 8.8

## 2019-04-04 MED ORDER — METOPROLOL TARTRATE 5 MG/5ML IV SOLN: 5.0000 mg | INTRAVENOUS | Status: DC | PRN

## 2019-04-04 MED ORDER — MOMETASONE FURO-FORMOTEROL FUM 200-5 MCG/ACT IN AERO
2.0000 | INHALATION_SPRAY | Freq: Two times a day (BID) | RESPIRATORY_TRACT | Status: DC
  Administered 2019-04-04 – 2019-04-05 (×2): 2 via RESPIRATORY_TRACT
  Filled 2019-04-04: qty 8.8

## 2019-04-04 NOTE — Progress Notes (Signed)
Report called to [place] on 3E. Patient will be transferred via wheelchair to 3E29. Clothing and shoes sent with patient. No cell phones at bedside or wallet. Call placed to daughter to notify her of patient's transfer.  Milford Cage, RN

## 2019-04-04 NOTE — Progress Notes (Signed)
Notified MD of patient complaining of chest pain. Patient indicated he has had all day but it's starting to bother him more now. Pain is aching and is right on chest pain without it radiating anywhere else.  MD ordered stat EKG, prn Lopressorl for his HR >110.  He received his coreg 12.5mg  30 minutes earlier then scheduled.  Will continue to monitor patient.

## 2019-04-04 NOTE — Progress Notes (Signed)
[facility]  PROGRESS NOTE  Eddie Rivera JTT:[id number] DOB: [birth year] DOA: 04/02/2019 PCP: No primary care provider on file.  Brief History    Eddie Rivera is a 73 y.o. year old male with medical history significant for chronic hypoxic respiratory failure (on 2 to 4 L) secondary to COPD, obesity, Crohn's colitis, prior history of lung cancer who presented on 04/02/2019 as transfer from outside hospital and was intubated there given altered mental status after to presenting with agitation after taking Ambien and received Haldol and Ativan in outside ER and became more obtunded.  Per chart review there was some concern patient was taking [name].  However further interval history with patient today he reports he was recently hospitalized at the [facility] in [place] on 7/21.  At that time they were concerned for hypercarbia for which they wanted him to stay an additional day for BiPAP, patient declined and agreed to continue using his BiPAP at home.  He remembers taking Ambien which he is normally prescribed.  He admits his mother passed away earlier this month.  His son reported him acting odd.  During his hospital stay, patient was rapidly weaned from vent within 24 hours of arrival.  Now patient is on 4 L (chronically on 2 to 4 L at home).  With no complaints of shortness of breath.  Lab work-up notable for negative blood cultures, normal amylase, normal ammonia, normal lipase, negative strep pneumo, negative Coley testing, negative urine culture, BMP, CBC all within normal limits.    On 4.5 L ( at home on 2-4 L) AF, 110s-120s Foley No crackles or wheezes Alert and oriented x 4  Crohn's colitis Mesalamine Stools dark, not black    A & P     Acute on chronic hypoxic respiratory/hypercarbic respiratory failure, stable.  Was extubated uneventfully on 7/24.  Currently back on home O2 regimen.  Will transfer out of ICU to floor for closer monitoring.  CO2 now 51.4  most recent ABG.  Normal mental status.  Infectious etiology thought less likely given negative work-up to date.  Suspect related to daily medications (Ambien) in setting of chronic hypoxic/hypercarbic respiratory failure related to obesity/COPD.   Crohn's colitis.  Continue home mesalamine.  Reports darkening stool no overt blood loss.  Monitor hemoglobin.  Hold on Imodium for now.   COPD.  No wheezing on exam.  Currently on scheduled DuoNebs, will resume home inhaler regimen.  Continue supplemental O2 as needed.   Atrial fibrillation, intermittent RVR.  Heart rate ranging 110s to 120s at rest.  Will resume home carvedilol.  Chads VASC:0 monitor on telemetry     DVT prophylaxis: SCDs Code Status: Full code Family Communication: No family at bedside Disposition Plan: Transfer out of ICU to regular floor and closely monitor respiratory status, and heart rate/atrial fibrillation, anticipate discharge next 24 hours if remains stable.     [facility] Direct contact: see [email or web address] (further directions at bottom of note if needed) 7PM-7AM contact night coverage as at bottom of note 04/04/2019, 11:55 AM  LOS: 2 days   Consultants  . PCCM  Procedures  . Intubation 7/23, extubation 7/24  Antibiotics  . None  Interval History/Subjective  Feels fine, no shortness of breath, no chest pain, no abdominal pain.  Hopeful to go home soon  Objective   Vitals:  Vitals:   04/04/19 1000 04/04/19 1100  BP: 106/75 117/84  Pulse: (!) 102 (!) 106  Resp: (!) 21 16  Temp:  SpO2: 95% 100%    Exam:  Obese male, awake Alert, Oriented X 3, No new F.N deficits, Normal affect Oso.AT Supple Neck,No JVD,  Symmetrical Chest wall movement, Good air movement bilaterally, CTAB Irregularly irregular, tachycardic no Gallops,Rubs or new Murmurs, No Parasternal Heave +ve B.Sounds, Abd Soft, No tenderness, No organomegaly appriciated, No rebound - guarding or rigidity. No Cyanosis, Clubbing or  edema, No new Rash or bruise Flat affect   I have personally reviewed the following:   Data Reviewed: Basic Metabolic Panel: Recent Labs  Lab 04/02/19 1209 04/03/19 0243 04/03/19 0644 04/04/19 0241  NA 138 135 138 140  K 4.3 4.1 4.5 4.3  CL 92*  --  94* 97*  CO2 36*  --  32 30  GLUCOSE 159*  --  155* 97  BUN 19  --  22 23  CREATININE 1.01  --  1.04 0.95  CALCIUM 8.9  --  8.8* 8.7*  MG 1.8  --   --   --   PHOS 2.7  --   --   --    Liver Function Tests: Recent Labs  Lab 04/02/19 1209  AST 23  ALT 22  ALKPHOS 65  BILITOT 0.7  PROT 6.2*  ALBUMIN 3.4*   Recent Labs  Lab 04/02/19 1209  LIPASE 18  AMYLASE 26*   Recent Labs  Lab 04/03/19 0826  AMMONIA 28   CBC: Recent Labs  Lab 04/02/19 1209 04/03/19 0243 04/03/19 0644 04/04/19 0241  WBC 8.6  --  11.3* 12.6*  HGB 13.7 15.6 14.1 14.8  HCT 44.5 46.0 45.2 50.4  MCV 94.7  --  93.6 99.6  PLT 219  --  234 220   Cardiac Enzymes: No results for input(s): CKTOTAL, CKMB, CKMBINDEX, TROPONINI in the last 168 hours. BNP (last 3 results) No results for input(s): BNP in the last 8760 hours.  ProBNP (last 3 results) No results for input(s): PROBNP in the last 8760 hours.  CBG: Recent Labs  Lab 04/03/19 0334 04/03/19 0718 04/03/19 1122 04/03/19 1547 04/03/19 1939  GLUCAP 151* 153* 121* 90 119*    Recent Results (from the past 240 hour(s))  MRSA PCR Screening     Status: Abnormal   Collection Time: 04/02/19 10:57 AM   Specimen: Nasal Mucosa; Nasopharyngeal  Result Value Ref Range Status   MRSA by PCR POSITIVE (A) NEGATIVE Final    Comment:        The GeneXpert MRSA Assay (FDA approved for NASAL specimens only), is one component of a comprehensive MRSA colonization surveillance program. It is not intended to diagnose MRSA infection nor to guide or monitor treatment for MRSA infections. RESULT CALLED TO, READ BACK BY AND VERIFIED WITH: Cleon Dew RN 12:35 04/02/19 ([email or web address]) Performed at [facility], [place] [address], [place], [place] [address]   SARS Coronavirus 2 [facility] order, Performed in [facility] hospital lab)     Status: None   Collection Time: 04/02/19 10:59 AM  Result Value Ref Range Status   SARS Coronavirus 2 NEGATIVE NEGATIVE Final    Comment: (NOTE) If result is NEGATIVE SARS-CoV-2 target nucleic acids are NOT DETECTED. The SARS-CoV-2 RNA is generally detectable in upper and lower  respiratory specimens during the acute phase of infection. The lowest  concentration of SARS-CoV-2 viral copies this assay can detect is 250  copies / mL. A negative result does not preclude SARS-CoV-2 infection  and should not be used as the sole basis for treatment or other  patient management decisions.  A negative result may occur with  improper specimen collection / handling, submission of specimen other  than nasopharyngeal swab, presence of viral mutation(s) within the  areas targeted by this assay, and inadequate number of viral copies  (<250 copies / mL). A negative result must be combined with clinical  observations, patient history, and epidemiological information. If result is POSITIVE SARS-CoV-2 target nucleic acids are DETECTED. The SARS-CoV-2 RNA is generally detectable in upper and lower  respiratory specimens dur ing the acute phase of infection.  Positive  results are indicative of active infection with SARS-CoV-2.  Clinical  correlation with patient history and other diagnostic information is  necessary to determine patient infection status.  Positive results do  not rule out bacterial infection or co-infection with other viruses. If result is PRESUMPTIVE POSTIVE SARS-CoV-2 nucleic acids MAY BE PRESENT.   A presumptive positive result was obtained on the submitted specimen  and confirmed on repeat testing.  While 2019 novel coronavirus  (SARS-CoV-2) nucleic acids may be present in the submitted sample  additional confirmatory testing may be necessary for  epidemiological  and / or clinical management purposes  to differentiate between  SARS-CoV-2 and other Sarbecovirus currently known to infect humans.  If clinically indicated additional testing with an alternate test  methodology [phone]) is advised. The SARS-CoV-2 RNA is generally  detectable in upper and lower respiratory sp ecimens during the acute  phase of infection. The expected result is Negative. Fact Sheet for Patients:  StrictlyIdeas.no Fact Sheet for Healthcare Providers: BankingDealers.co.za This test is not yet approved or cleared by the [place] FDA and has been authorized for detection and/or diagnosis of SARS-CoV-2 by FDA under an Emergency Use Authorization (EUA).  This EUA will remain in effect (meaning this test can be used) for the duration of the COVID-19 declaration under Section 564(b)(1) of the Act, 21 U.S.C. section 360bbb-3(b)(1), unless the authorization is terminated or revoked sooner. Performed at [facility], [place] [address] [place], [place] [address]   Culture, blood (routine x 2)     Status: None (Preliminary result)   Collection Time: 04/02/19 12:00 PM   Specimen: BLOOD RIGHT HAND  Result Value Ref Range Status   Specimen Description BLOOD RIGHT HAND  Final   Special Requests   Final    BOTTLES DRAWN AEROBIC ONLY Blood Culture adequate volume   Culture   Final    NO GROWTH 2 DAYS Performed at [facility], [place] [address] [place], [place] [address]    Report Status PENDING  Incomplete  Culture, blood (routine x 2)     Status: None (Preliminary result)   Collection Time: 04/02/19 12:20 PM   Specimen: BLOOD  Result Value Ref Range Status   Specimen Description BLOOD RIGHT ANTECUBITAL  Final   Special Requests   Final    BOTTLES DRAWN AEROBIC ONLY Blood Culture adequate volume   Culture   Final    NO GROWTH 2 DAYS Performed at [facility], [place] [address], [place],  [place] [address]    Report Status PENDING  Incomplete  Urine culture     Status: None   Collection Time: 04/02/19  5:00 PM   Specimen: Urine, Catheterized  Result Value Ref Range Status   Specimen Description URINE, CATHETERIZED  Final   Special Requests NONE  Final   Culture   Final    NO GROWTH Performed at [facility], [place] [address],  [place], [place] [address]    Report Status 04/03/2019 FINAL  Final     Studies: No results found.  Scheduled Meds: . chlorhexidine  15 mL Mouth Rinse BID  . Chlorhexidine Gluconate Cloth  6 each Topical Daily  . ipratropium-albuterol  3 mL Nebulization Q6H  . mouth rinse  15 mL Mouth Rinse q12n4p  . pantoprazole (PROTONIX) IV  40 mg Intravenous QHS   Continuous Infusions: . sodium chloride Stopped (04/03/19 1600)  . dexmedetomidine (PRECEDEX) IV infusion Stopped (04/03/19 1000)    Active Problems:   Encephalopathy acute   Hypoxemic respiratory failure, chronic (HCC)   Respiratory failure ([place])      Desiree Hane  [facility]

## 2019-04-04 NOTE — Progress Notes (Signed)
Pt arrived to 3e29. A&Ox4. No complaints of pain. SR on telemetry. No skin breakdown. Mod fall risk. Oriented to room and call system. Vital signs stable.

## 2019-04-04 NOTE — Progress Notes (Signed)
Paged MD patient suddenly had sharp pain in his chest.  HR 99 BP 96/84.  Patient is not in distressed and just wanted to inform me about the sudden sharpness of the pain.  Order for stat Troponin placed.  Will continue to monitor patient.

## 2019-04-05 DIAGNOSIS — K50119 Crohn's disease of large intestine with unspecified complications: Secondary | ICD-10-CM

## 2019-04-05 DIAGNOSIS — G9340 Encephalopathy, unspecified: Secondary | ICD-10-CM

## 2019-04-05 LAB — CBC
HCT: 47.8 % (ref 39.0–52.0)
Hemoglobin: 14.2 g/dL (ref 13.0–17.0)
MCH: 28.9 pg (ref 26.0–34.0)
MCHC: 29.7 g/dL — ABNORMAL LOW (ref 30.0–36.0)
MCV: 97.4 fL (ref 80.0–100.0)
Platelets: 215 10*3/uL (ref 150–400)
RBC: 4.91 MIL/uL (ref 4.22–5.81)
RDW: 15.4 % (ref 11.5–15.5)
WBC: 9.1 10*3/uL (ref 4.0–10.5)
nRBC: 0 % (ref 0.0–0.2)

## 2019-04-05 LAB — BASIC METABOLIC PANEL
Anion gap: 9 (ref 5–15)
BUN: 17 mg/dL (ref 8–23)
CO2: 32 mmol/L (ref 22–32)
Calcium: 8.6 mg/dL — ABNORMAL LOW (ref 8.9–10.3)
Chloride: 97 mmol/L — ABNORMAL LOW (ref 98–111)
Creatinine, Ser: 1.06 mg/dL (ref 0.61–1.24)
GFR calc Af Amer: >60 mL/min (ref >60)
GFR calc non Af Amer: >60 mL/min (ref >60)
Glucose, Bld: 119 mg/dL — ABNORMAL HIGH (ref 70–99)
Potassium: 5.8 mmol/L — ABNORMAL HIGH (ref 3.5–5.1)
Sodium: 138 mmol/L (ref 135–145)

## 2019-04-05 LAB — GLUCOSE, CAPILLARY: Glucose-Capillary: 119 mg/dL — ABNORMAL HIGH (ref 70–99)

## 2019-04-05 MED ORDER — MESALAMINE 1.2 G PO TBEC
2.4000 g | DELAYED_RELEASE_TABLET | Freq: Every day | ORAL | Status: DC
  Filled 2019-04-05 (×2): qty 2

## 2019-04-05 NOTE — Discharge Summary (Signed)
Worthington Cruzan Pilar IWL:[id number] DOB: [birth year] DOA: 04/02/2019  PCP: No primary care provider on file.  Admit date: 04/02/2019 Discharge date: 04/05/2019  Admitted From: Home Disposition:  Home  Recommendations for Outpatient Follow-up:  1. Follow up with PCP in 1-2 weeks. 2. Please obtain BMP/CBC in one week 3. Discontinue further ambien use  Home Health:YES (PT and OT) Equipment/Devices:none  Discharge Condition:STABLE  CODE STATUS:FULL Diet recommendation: Regular  Brief/Interim Summary: History of present illness:  Eddie Rivera is a 73 y.o. year old male with medical history significant for chronic hypoxic respiratory failure (on 2 to 4 L) secondary to COPD, obesity, Crohn's colitis, prior history of lung cancer who presented on 04/02/2019 as transfer from outside hospital and was intubated there given altered mental status after to presenting with agitation after taking Ambien and received Haldol and Ativan in outside ER and became more obtunded.   Per chart review there was some concern patient was taking [name].  However further interval history with patient today he reports he was recently hospitalized at the [facility] in [place] on 7/21.  At that time they were concerned for hypercarbia for which they wanted him to stay an additional day for BiPAP, patient declined and agreed to continue using his BiPAP at home.  He remembers taking Ambien which he is normally prescribed.  He admits his mother passed away earlier this month.  His son reported him acting odd.  . Remaining hospital course addressed in problem based format below:   Hospital Course:    Acute on chronic hypoxic/hypercarbic respiratory failure, stable.  Patient was intubated prior to arrival is calm, was able to extubate within 24 hours of arrival without any complications  Believe primary cause of acute distress related to COPD exacerbation with hypercarbia worsened in the setting of Ambien  use  Lab work-up notable for within normal limit blood cultures, normal amylase, normal ammonia, normal lipase, negative strep pneumo, negative COVID testing, BMP and CBC all within normal limits  Prior to discharge patient was able to return back to his home oxygen regimen and maintain normal oxygen saturations with ambulation  On discharge patient was instructed to not continue further use of Ambien   COPD with acute exacerbation.  As above patient doing well after extubation on 7/24.  On discharge will continue previous home inhaler regimen.  Strictly encouraged to discontinue further use of Ambien and continue using BiPAP at home.  Vitals, stable.  Continue home mesalamine.   Atrial fibrillation.  Had intermittent episodes of RVR while holding home carvedilol in the ICU.  With resumption of home carvedilol heart rate remained less than 110.  Continue home aspirin    Consultations:  PCCM  Procedures/Studies: Intubation 7/23, extubation 7/24 Subjective: Feels breathing is much better.  No abdominal pain.  Ready go home. Discharge Exam: Vitals:   04/05/19 0812 04/05/19 0823  BP:  103/66  Pulse:  (!) 103  Resp:  20  Temp:  98.5 F (36.9 C)  SpO2: 93% 92%   Vitals:   04/05/19 0236 04/05/19 0422 04/05/19 0812 04/05/19 0823  BP:  132/69  103/66  Pulse:  97  (!) 103  Resp:  18  20  Temp:  (!) 100.4 F (38 C)  98.5 F (36.9 C)  TempSrc:  Oral  Oral  SpO2: 95% 93% 93% 92%  Weight:      Height:        Obese male, awake Alert, Oriented X 3, No new F.N deficits,  Normal affect [place].AT Supple Neck,No JVD,  Symmetrical Chest wall movement, Good air movement bilaterally, CTAB Irregularly irregular, normal rate, no Gallops,Rubs or new Murmurs, No Parasternal Heave +ve B.Sounds, Abd Soft, No tenderness, No organomegaly appriciated, No rebound - guarding or rigidity. No Cyanosis, Clubbing or edema, No new Rash or bruise  Discharge Diagnoses:  Active Problems:    Encephalopathy acute   Hypoxemic respiratory failure, chronic (HCC)   Respiratory failure (HCC)   COPD (chronic obstructive pulmonary disease) (HCC)   Acute on chronic respiratory failure with hypoxia and hypercapnia (HCC)   Crohn's colitis (HCC)   Atrial fibrillation with RVR [facility])    Discharge Instructions  Discharge Instructions    Diet - low sodium heart healthy   Complete by: As directed    Increase activity slowly   Complete by: As directed      Allergies as of 04/05/2019      Reactions   Atorvastatin Other (See Comments)   Patient doesn't recall the reaction (??)      Medication List    STOP taking these medications   zolpidem 10 MG tablet Commonly known as: AMBIEN     TAKE these medications   albuterol (2.5 MG/3ML) 0.083% nebulizer solution Commonly known as: PROVENTIL Take 2.5 mg by nebulization See admin instructions. Nebulize 1 vial every 4-6 hours as needed for shortness of breath or wheezing   ProAir HFA 108 (90 Base) MCG/ACT inhaler Generic drug: albuterol Inhale 2 puffs into the lungs every 6 (six) hours as needed for wheezing or shortness of breath.   aspirin EC 325 MG tablet Take 325 mg by mouth daily.   budesonide-formoterol 160-4.5 MCG/ACT inhaler Commonly known as: SYMBICORT Inhale 2 puffs into the lungs 2 (two) times daily.   carvedilol 12.5 MG tablet Commonly known as: COREG Take 12.5 mg by mouth 2 (two) times daily with a meal.   cetirizine 10 MG tablet Commonly known as: ZYRTEC Take 10 mg by mouth daily.   clotrimazole 1 % cream Commonly known as: LOTRIMIN Apply 1 application topically See admin instructions. Apply a small amount daily as directed/as needed to legs and feet   fluticasone 50 MCG/ACT nasal spray Commonly known as: FLONASE Place 2 sprays into both nostrils daily.   gabapentin 400 MG capsule Commonly known as: NEURONTIN Take 400 mg by mouth 2 (two) times daily.   loperamide 2 MG capsule Commonly known as:  IMODIUM Take 4 mg by mouth 2 (two) times daily as needed for diarrhea or loose stools.   mesalamine 1.2 g EC tablet Commonly known as: LIALDA Take 2.4 g by mouth daily with breakfast.   omeprazole 40 MG capsule Commonly known as: PRILOSEC Take 40 mg by mouth daily before breakfast.   tiotropium 18 MCG inhalation capsule Commonly known as: SPIRIVA Place 18 mcg into inhaler and inhale daily.       Allergies  Allergen Reactions  . Atorvastatin Other (See Comments)    Patient doesn't recall the reaction (??)        The results of significant diagnostics from this hospitalization (including imaging, microbiology, ancillary and laboratory) are listed below for reference.     Microbiology: Recent Results (from the past 240 hour(s))  MRSA PCR Screening     Status: Abnormal   Collection Time: 04/02/19 10:57 AM   Specimen: Nasal Mucosa; Nasopharyngeal  Result Value Ref Range Status   MRSA by PCR POSITIVE (A) NEGATIVE Final    Comment:        The  GeneXpert MRSA Assay (FDA approved for NASAL specimens only), is one component of a comprehensive MRSA colonization surveillance program. It is not intended to diagnose MRSA infection nor to guide or monitor treatment for MRSA infections. RESULT CALLED TO, READ BACK BY AND VERIFIED WITH: Cleon Dew RN 12:35 04/02/19 ([email or web address]) Performed at [facility], [place] [address] [place], [place] [address]   SARS Coronavirus 2 [facility] order, Performed in [facility] hospital lab)     Status: None   Collection Time: 04/02/19 10:59 AM  Result Value Ref Range Status   SARS Coronavirus 2 NEGATIVE NEGATIVE Final    Comment: (NOTE) If result is NEGATIVE SARS-CoV-2 target nucleic acids are NOT DETECTED. The SARS-CoV-2 RNA is generally detectable in upper and lower  respiratory specimens during the acute phase of infection. The lowest  concentration of SARS-CoV-2 viral copies this assay can detect is 250  copies / mL. A negative result  does not preclude SARS-CoV-2 infection  and should not be used as the sole basis for treatment or other  patient management decisions.  A negative result may occur with  improper specimen collection / handling, submission of specimen other  than nasopharyngeal swab, presence of viral mutation(s) within the  areas targeted by this assay, and inadequate number of viral copies  (<250 copies / mL). A negative result must be combined with clinical  observations, patient history, and epidemiological information. If result is POSITIVE SARS-CoV-2 target nucleic acids are DETECTED. The SARS-CoV-2 RNA is generally detectable in upper and lower  respiratory specimens dur ing the acute phase of infection.  Positive  results are indicative of active infection with SARS-CoV-2.  Clinical  correlation with patient history and other diagnostic information is  necessary to determine patient infection status.  Positive results do  not rule out bacterial infection or co-infection with other viruses. If result is PRESUMPTIVE POSTIVE SARS-CoV-2 nucleic acids MAY BE PRESENT.   A presumptive positive result was obtained on the submitted specimen  and confirmed on repeat testing.  While 2019 novel coronavirus  (SARS-CoV-2) nucleic acids may be present in the submitted sample  additional confirmatory testing may be necessary for epidemiological  and / or clinical management purposes  to differentiate between  SARS-CoV-2 and other Sarbecovirus currently known to infect humans.  If clinically indicated additional testing with an alternate test  methodology [phone]) is advised. The SARS-CoV-2 RNA is generally  detectable in upper and lower respiratory sp ecimens during the acute  phase of infection. The expected result is Negative. Fact Sheet for Patients:  StrictlyIdeas.no Fact Sheet for Healthcare Providers: BankingDealers.co.za This test is not yet approved or  cleared by the [place] FDA and has been authorized for detection and/or diagnosis of SARS-CoV-2 by FDA under an Emergency Use Authorization (EUA).  This EUA will remain in effect (meaning this test can be used) for the duration of the COVID-19 declaration under Section 564(b)(1) of the Act, 21 U.S.C. section 360bbb-3(b)(1), unless the authorization is terminated or revoked sooner. Performed at [facility], [place] [address] [place], [place] [address]   Culture, blood (routine x 2)     Status: None (Preliminary result)   Collection Time: 04/02/19 12:00 PM   Specimen: BLOOD RIGHT HAND  Result Value Ref Range Status   Specimen Description BLOOD RIGHT HAND  Final   Special Requests   Final    BOTTLES DRAWN AEROBIC ONLY Blood Culture adequate volume   Culture   Final    NO  GROWTH 3 DAYS Performed at [facility], [place] [address] [place], [place] [address]    Report Status PENDING  Incomplete  Culture, blood (routine x 2)     Status: None (Preliminary result)   Collection Time: 04/02/19 12:20 PM   Specimen: BLOOD  Result Value Ref Range Status   Specimen Description BLOOD RIGHT ANTECUBITAL  Final   Special Requests   Final    BOTTLES DRAWN AEROBIC ONLY Blood Culture adequate volume   Culture   Final    NO GROWTH 3 DAYS Performed at [facility], [place] [address] [place], [place] [address]    Report Status PENDING  Incomplete  Urine culture     Status: None   Collection Time: 04/02/19  5:00 PM   Specimen: Urine, Catheterized  Result Value Ref Range Status   Specimen Description URINE, CATHETERIZED  Final   Special Requests NONE  Final   Culture   Final    NO GROWTH Performed at [facility], [place] [address], [place], [place] [address]    Report Status 04/03/2019 FINAL  Final     Labs: BNP (last 3 results) No results for input(s): BNP in the last 8760 hours. Basic Metabolic Panel: Recent Labs  Lab 04/02/19 1209 04/03/19 0243 04/03/19 0644  04/04/19 0241 04/05/19 0734  NA 138 135 138 140 138  K 4.3 4.1 4.5 4.3 5.8*  CL 92*  --  94* 97* 97*  CO2 36*  --  32 30 32  GLUCOSE 159*  --  155* 97 119*  BUN 19  --  22 23 17   CREATININE 1.01  --  1.04 0.95 1.06  CALCIUM 8.9  --  8.8* 8.7* 8.6*  MG 1.8  --   --   --   --   PHOS 2.7  --   --   --   --    Liver Function Tests: Recent Labs  Lab 04/02/19 1209  AST 23  ALT 22  ALKPHOS 65  BILITOT 0.7  PROT 6.2*  ALBUMIN 3.4*   Recent Labs  Lab 04/02/19 1209  LIPASE 18  AMYLASE 26*   Recent Labs  Lab 04/03/19 0826  AMMONIA 28   CBC: Recent Labs  Lab 04/02/19 1209 04/03/19 0243 04/03/19 0644 04/04/19 0241 04/05/19 0734  WBC 8.6  --  11.3* 12.6* 9.1  HGB 13.7 15.6 14.1 14.8 14.2  HCT 44.5 46.0 45.2 50.4 47.8  MCV 94.7  --  93.6 99.6 97.4  PLT 219  --  234 220 215   Cardiac Enzymes: No results for input(s): CKTOTAL, CKMB, CKMBINDEX, TROPONINI in the last 168 hours. BNP: Invalid input(s): POCBNP CBG: Recent Labs  Lab 04/03/19 0334 04/03/19 0718 04/03/19 1122 04/03/19 1547 04/03/19 1939  GLUCAP 151* 153* 121* 90 119*   D-Dimer No results for input(s): DDIMER in the last 72 hours. Hgb A1c No results for input(s): HGBA1C in the last 72 hours. Lipid Profile No results for input(s): CHOL, HDL, LDLCALC, TRIG, CHOLHDL, LDLDIRECT in the last 72 hours. Thyroid function studies No results for input(s): TSH, T4TOTAL, T3FREE, THYROIDAB in the last 72 hours.  Invalid input(s): FREET3 Anemia work up No results for input(s): VITAMINB12, FOLATE, FERRITIN, TIBC, IRON, RETICCTPCT in the last 72 hours. Urinalysis No results found for: COLORURINE, APPEARANCEUR, LABSPEC, [place], GLUCOSEU, [place], [place], [place], PROTEINUR, UROBILINOGEN, NITRITE, LEUKOCYTESUR Sepsis Labs Invalid input(s): PROCALCITONIN,  WBC,  LACTICIDVEN Microbiology Recent Results (from the past 240 hour(s))  MRSA PCR Screening  Status: Abnormal   Collection Time: 04/02/19 10:57 AM    Specimen: Nasal Mucosa; Nasopharyngeal  Result Value Ref Range Status   MRSA by PCR POSITIVE (A) NEGATIVE Final    Comment:        The GeneXpert MRSA Assay (FDA approved for NASAL specimens only), is one component of a comprehensive MRSA colonization surveillance program. It is not intended to diagnose MRSA infection nor to guide or monitor treatment for MRSA infections. RESULT CALLED TO, READ BACK BY AND VERIFIED WITH: Cleon Dew RN 12:35 04/02/19 ([email or web address]) Performed at [facility], [place] [address], [place], [place] [address]   SARS Coronavirus 2 [facility] order, Performed in [facility] hospital lab)     Status: None   Collection Time: 04/02/19 10:59 AM  Result Value Ref Range Status   SARS Coronavirus 2 NEGATIVE NEGATIVE Final    Comment: (NOTE) If result is NEGATIVE SARS-CoV-2 target nucleic acids are NOT DETECTED. The SARS-CoV-2 RNA is generally detectable in upper and lower  respiratory specimens during the acute phase of infection. The lowest  concentration of SARS-CoV-2 viral copies this assay can detect is 250  copies / mL. A negative result does not preclude SARS-CoV-2 infection  and should not be used as the sole basis for treatment or other  patient management decisions.  A negative result may occur with  improper specimen collection / handling, submission of specimen other  than nasopharyngeal swab, presence of viral mutation(s) within the  areas targeted by this assay, and inadequate number of viral copies  (<250 copies / mL). A negative result must be combined with clinical  observations, patient history, and epidemiological information. If result is POSITIVE SARS-CoV-2 target nucleic acids are DETECTED. The SARS-CoV-2 RNA is generally detectable in upper and lower  respiratory specimens dur ing the acute phase of infection.  Positive  results are indicative of active infection with SARS-CoV-2.  Clinical  correlation with patient history and other  diagnostic information is  necessary to determine patient infection status.  Positive results do  not rule out bacterial infection or co-infection with other viruses. If result is PRESUMPTIVE POSTIVE SARS-CoV-2 nucleic acids MAY BE PRESENT.   A presumptive positive result was obtained on the submitted specimen  and confirmed on repeat testing.  While 2019 novel coronavirus  (SARS-CoV-2) nucleic acids may be present in the submitted sample  additional confirmatory testing may be necessary for epidemiological  and / or clinical management purposes  to differentiate between  SARS-CoV-2 and other Sarbecovirus currently known to infect humans.  If clinically indicated additional testing with an alternate test  methodology [phone]) is advised. The SARS-CoV-2 RNA is generally  detectable in upper and lower respiratory sp ecimens during the acute  phase of infection. The expected result is Negative. Fact Sheet for Patients:  StrictlyIdeas.no Fact Sheet for Healthcare Providers: BankingDealers.co.za This test is not yet approved or cleared by the [place] FDA and has been authorized for detection and/or diagnosis of SARS-CoV-2 by FDA under an Emergency Use Authorization (EUA).  This EUA will remain in effect (meaning this test can be used) for the duration of the COVID-19 declaration under Section 564(b)(1) of the Act, 21 U.S.C. section 360bbb-3(b)(1), unless the authorization is terminated or revoked sooner. Performed at [facility], [place] [address], [place], [place] [address]   Culture, blood (routine x 2)     Status: None (Preliminary result)   Collection Time: 04/02/19 12:00 PM   Specimen: BLOOD RIGHT HAND  Result Value Ref Range Status   Specimen Description BLOOD RIGHT HAND  Final   Special Requests   Final    BOTTLES DRAWN AEROBIC ONLY Blood Culture adequate volume   Culture   Final    NO GROWTH 3 DAYS Performed at [facility], [address] [place], [place] [address]    Report Status PENDING  Incomplete  Culture, blood (routine x 2)     Status: None (Preliminary result)   Collection Time: 04/02/19 12:20 PM   Specimen: BLOOD  Result Value Ref Range Status   Specimen Description BLOOD RIGHT ANTECUBITAL  Final   Special Requests   Final    BOTTLES DRAWN AEROBIC ONLY Blood Culture adequate volume   Culture   Final    NO GROWTH 3 DAYS Performed at [facility], [place] [address], [place], [place] [address]    Report Status PENDING  Incomplete  Urine culture     Status: None   Collection Time: 04/02/19  5:00 PM   Specimen: Urine, Catheterized  Result Value Ref Range Status   Specimen Description URINE, CATHETERIZED  Final   Special Requests NONE  Final   Culture   Final    NO GROWTH Performed at [facility], [place] [address], [place],  [address]    Report Status 04/03/2019 FINAL  Final     Time coordinating discharge: Over 30 minutes  SIGNED:   Desiree Hane, MD  [facility] 04/05/2019, 9:32 AM Pager   If 7PM-7AM, please contact night-coverage [email or web address] Password [email or web address]

## 2019-04-05 NOTE — TOC Transition Note (Signed)
Transition of Care [facility]) - CM/SW Discharge Note   Patient Details  Name: Eddie Rivera MRN: [id number] Date of Birth: 02-07-1946  [facility] CM/SW Contact:  Carles Collet, RN Phone Number: 04/05/2019, 10:24 AM   Clinical Narrative:   Eddie Rivera w patient at bedside. [place] arranged through [facility]. His daughter Eddie Rivera will be able to provide transportationhome and is bringing his oxygen from home. Her number is [phone]. Informed nurse assigned to this patient to call daughter at her request.  No further CM needs    Final next level of care: [place] Barriers to Discharge: No Barriers Identified   Patient Goals and CMS Choice Patient states their goals for this hospitalization and ongoing recovery are:: to go home CMS Medicare.gov Compare Post Acute Care list provided to:: Patient Choice offered to / list presented to : Patient  Discharge Placement                       Discharge Plan and Services                          HH Arranged: PT [facility] Agency: [place] ([facility]) Date [place]: 04/05/19 Time [place]: 1021 Representative spoke with at [place]: [place] ([place]) Interventions     Readmission Risk Interventions No flowsheet data found.

## 2019-04-05 NOTE — Progress Notes (Signed)
Page to MD:  DC has to be re-reconciled, pt ride is also here.

## 2019-04-05 NOTE — Evaluation (Signed)
Occupational Therapy Evaluation Patient Details Name: Eddie Rivera MRN: [id number] DOB: Apr 18, 1946 Today's Date: 04/05/2019    History of Present Illness Pt adm with AMS resulting in respiratory failure requiring short term intubation. Pt had taken too much ambien. PMH - O2 dependent copd, obesity, lung ca   Clinical Impression   PTA patient independent ADLs, light IADLs and mobility using cane.  Admitted for above and limited by problem list below, including impaired balance, decreased activity tolerance and endurance.  Pt on 4L supplemental oxygen via [place], spo2 maintained >92%.  Completes transfer with min guard, in room mobility with min guard, LB ADLs with mod assist (although typically uses AE), UB ADls with supervision.  He is progressing well, but fatigues easily.  Reports he will have 24/7 support from daughter initially. Will benefit from further OT services while admitted and after dc at [facility] level in order to maximize independence and tolerance for ADLs/mobility.      Follow Up Recommendations  Home health OT;Supervision/Assistance - 24 hour    Equipment Recommendations  None recommended by OT    Recommendations for Other Services       Precautions / Restrictions Precautions Precautions: Fall Restrictions Weight Bearing Restrictions: No      Mobility Bed Mobility Overal bed mobility: Needs Assistance Bed Mobility: Supine to Sit;Sit to Supine     Supine to sit: Supervision Sit to supine: Supervision   General bed mobility comments: increased time, no physical support  Transfers Overall transfer level: Needs assistance Equipment used: Rolling walker (2 wheeled) Transfers: Sit to/from Stand Sit to Stand: Min guard         General transfer comment: min guard for safety/ balance    Balance Overall balance assessment: Needs assistance Sitting-balance support: No upper extremity supported;Feet supported Sitting balance-Leahy Scale: Good     Standing  balance support: During functional activity;No upper extremity supported Standing balance-Leahy Scale: Fair Standing balance comment: min guard for mobility, reaching out for UE support                           ADL either performed or assessed with clinical judgement   ADL Overall ADL's : Needs assistance/impaired     Grooming: Min guard;Standing   Upper Body Bathing: Set up;Sitting   Lower Body Bathing: Minimal assistance;Sit to/from stand Lower Body Bathing Details (indicate cue type and reason): decreased reach to feet, min guard sit<>stand Upper Body Dressing : Set up;Sitting   Lower Body Dressing: Minimal assistance;Sit to/from stand Lower Body Dressing Details (indicate cue type and reason): decreased reach to feet, min guard sit<>stand--typically uses AE (sock aide ) for socks at home  Toilet Transfer: Min guard;Ambulation           Functional mobility during ADLs: Min guard General ADL Comments: pt limited by decreased activity toleranc     Vision         Perception     Praxis      Pertinent Vitals/Pain Pain Assessment: No/denies pain     Hand Dominance Left   Extremity/Trunk Assessment Upper Extremity Assessment Upper Extremity Assessment: Generalized weakness   Lower Extremity Assessment Lower Extremity Assessment: Defer to PT evaluation       Communication Communication Communication: No difficulties   Cognition Arousal/Alertness: Awake/alert Behavior During Therapy: WFL for tasks assessed/performed Overall Cognitive Status: Impaired/Different from baseline Area of Impairment: Problem solving  Problem Solving: Slow processing     General Comments  4L supplemental O2 via [place] with saturations maintained > 92%    Exercises     Shoulder Instructions      Home Living Family/patient expects to be discharged to:: Private residence Living Arrangements: Alone Available Help at Discharge:  Family;Available PRN/intermittently Type of Home: House Home Access: Level entry     Home Layout: One level     Bathroom Shower/Tub: Occupational psychologist: Handicapped height     Home Equipment: Cane - single point;Walker - 2 wheels;Shower seat - built in;Grab bars - tub/shower;Hand held shower head;Grab bars - toilet;Adaptive equipment Adaptive Equipment: Reacher;Sock aid;Long-handled shoe horn;Long-handled sponge        Prior Functioning/Environment Level of Independence: Independent with assistive device(s)        Comments: uses cane for mobility, independent ADLs/IADLs (not yardwork)         OT Problem List: Decreased strength;Impaired balance (sitting and/or standing);Decreased safety awareness;Decreased knowledge of use of DME or AE;Decreased activity tolerance;Decreased knowledge of precautions;Cardiopulmonary status limiting activity;Obesity      OT Treatment/Interventions: Self-care/ADL training;Energy conservation;DME and/or AE instruction;Therapeutic activities;Balance training;Patient/family education;Therapeutic exercise    OT Goals(Current goals can be found in the care plan section) Acute Rehab OT Goals Patient Stated Goal: return home OT Goal Formulation: With patient Time For Goal Achievement: 04/19/19 Potential to Achieve Goals: Good  OT Frequency: Min 2X/week   Barriers to D/C:            Co-evaluation              AM-PAC OT "6 Clicks" Daily Activity     Outcome Measure Help from another person eating meals?: None Help from another person taking care of personal grooming?: A Little Help from another person toileting, which includes using toliet, bedpan, or urinal?: A Little Help from another person bathing (including washing, rinsing, drying)?: A Lot Help from another person to put on and taking off regular upper body clothing?: A Little Help from another person to put on and taking off regular lower body clothing?: A Lot 6  Click Score: 17   End of Session Equipment Utilized During Treatment: Oxygen Nurse Communication: Mobility status  Activity Tolerance: Patient tolerated treatment well Patient left: with call bell/phone within reach;in bed  OT Visit Diagnosis: Other abnormalities of gait and mobility (R26.89);Muscle weakness (generalized) (M62.81)                Time: 0539-7673 OT Time Calculation (min): 18 min Charges:  OT General Charges $OT Visit: 1 Visit OT Evaluation $OT Eval Moderate Complexity: [place], OT Acute Rehabilitation Services Pager [phone] Office [phone]   Delight Stare 04/05/2019, 9:46 AM

## 2019-04-07 ENCOUNTER — Encounter (HOSPITAL_COMMUNITY): Payer: Self-pay | Admitting: Emergency Medicine

## 2019-04-07 ENCOUNTER — Inpatient Hospital Stay (HOSPITAL_COMMUNITY)
Admission: EM | Admit: 2019-04-07 | Discharge: 2019-04-12 | DRG: 190 | Disposition: A | Discharge status: Home Health | Payer: No Typology Code available for payment source | Attending: Internal Medicine | Admitting: Internal Medicine

## 2019-04-07 ENCOUNTER — Other Ambulatory Visit: Payer: Self-pay

## 2019-04-07 DIAGNOSIS — R918 Other nonspecific abnormal finding of lung field: Secondary | ICD-10-CM | POA: Diagnosis not present

## 2019-04-07 DIAGNOSIS — I10 Essential (primary) hypertension: Secondary | ICD-10-CM

## 2019-04-07 DIAGNOSIS — Z515 Encounter for palliative care: Secondary | ICD-10-CM

## 2019-04-07 DIAGNOSIS — Z888 Allergy status to other drugs, medicaments and biological substances status: Secondary | ICD-10-CM

## 2019-04-07 DIAGNOSIS — E1165 Type 2 diabetes mellitus with hyperglycemia: Secondary | ICD-10-CM | POA: Diagnosis present

## 2019-04-07 DIAGNOSIS — J441 Chronic obstructive pulmonary disease with (acute) exacerbation: Secondary | ICD-10-CM | POA: Diagnosis not present

## 2019-04-07 DIAGNOSIS — I4891 Unspecified atrial fibrillation: Secondary | ICD-10-CM | POA: Diagnosis present

## 2019-04-07 DIAGNOSIS — K5010 Crohn's disease of large intestine without complications: Secondary | ICD-10-CM | POA: Diagnosis present

## 2019-04-07 DIAGNOSIS — D649 Anemia, unspecified: Secondary | ICD-10-CM | POA: Diagnosis present

## 2019-04-07 DIAGNOSIS — Z87891 Personal history of nicotine dependence: Secondary | ICD-10-CM

## 2019-04-07 DIAGNOSIS — R0602 Shortness of breath: Secondary | ICD-10-CM

## 2019-04-07 DIAGNOSIS — K219 Gastro-esophageal reflux disease without esophagitis: Secondary | ICD-10-CM | POA: Diagnosis present

## 2019-04-07 DIAGNOSIS — Z85118 Personal history of other malignant neoplasm of bronchus and lung: Secondary | ICD-10-CM

## 2019-04-07 DIAGNOSIS — J189 Pneumonia, unspecified organism: Secondary | ICD-10-CM | POA: Diagnosis present

## 2019-04-07 DIAGNOSIS — R0600 Dyspnea, unspecified: Secondary | ICD-10-CM | POA: Diagnosis not present

## 2019-04-07 DIAGNOSIS — Z7982 Long term (current) use of aspirin: Secondary | ICD-10-CM

## 2019-04-07 DIAGNOSIS — Z9981 Dependence on supplemental oxygen: Secondary | ICD-10-CM

## 2019-04-07 DIAGNOSIS — J9621 Acute and chronic respiratory failure with hypoxia: Secondary | ICD-10-CM | POA: Diagnosis present

## 2019-04-07 DIAGNOSIS — I110 Hypertensive heart disease with heart failure: Secondary | ICD-10-CM | POA: Diagnosis present

## 2019-04-07 DIAGNOSIS — I4819 Other persistent atrial fibrillation: Secondary | ICD-10-CM

## 2019-04-07 DIAGNOSIS — Z79899 Other long term (current) drug therapy: Secondary | ICD-10-CM

## 2019-04-07 DIAGNOSIS — G4733 Obstructive sleep apnea (adult) (pediatric): Secondary | ICD-10-CM | POA: Diagnosis present

## 2019-04-07 DIAGNOSIS — E873 Alkalosis: Secondary | ICD-10-CM | POA: Diagnosis present

## 2019-04-07 DIAGNOSIS — E669 Obesity, unspecified: Secondary | ICD-10-CM | POA: Diagnosis present

## 2019-04-07 DIAGNOSIS — Z7951 Long term (current) use of inhaled steroids: Secondary | ICD-10-CM

## 2019-04-07 DIAGNOSIS — I5033 Acute on chronic diastolic (congestive) heart failure: Secondary | ICD-10-CM | POA: Diagnosis present

## 2019-04-07 DIAGNOSIS — J9811 Atelectasis: Secondary | ICD-10-CM | POA: Diagnosis present

## 2019-04-07 DIAGNOSIS — E1169 Type 2 diabetes mellitus with other specified complication: Secondary | ICD-10-CM

## 2019-04-07 DIAGNOSIS — J9622 Acute and chronic respiratory failure with hypercapnia: Secondary | ICD-10-CM | POA: Diagnosis present

## 2019-04-07 DIAGNOSIS — Z20828 Contact with and (suspected) exposure to other viral communicable diseases: Secondary | ICD-10-CM | POA: Diagnosis present

## 2019-04-07 DIAGNOSIS — F1010 Alcohol abuse, uncomplicated: Secondary | ICD-10-CM | POA: Diagnosis present

## 2019-04-07 DIAGNOSIS — Z6836 Body mass index (BMI) 36.0-36.9, adult: Secondary | ICD-10-CM

## 2019-04-07 DIAGNOSIS — J440 Chronic obstructive pulmonary disease with acute lower respiratory infection: Secondary | ICD-10-CM | POA: Diagnosis present

## 2019-04-07 DIAGNOSIS — Z9111 Patient's noncompliance with dietary regimen: Secondary | ICD-10-CM

## 2019-04-07 HISTORY — DX: Malignant neoplasm of unspecified part of unspecified bronchus or lung: C34.90

## 2019-04-07 HISTORY — DX: Essential (primary) hypertension: I10

## 2019-04-07 HISTORY — DX: Crohn's disease of large intestine without complications: K50.10

## 2019-04-07 HISTORY — DX: Type 2 diabetes mellitus without complications: E11.9

## 2019-04-07 HISTORY — DX: Unspecified atrial fibrillation: I48.91

## 2019-04-07 LAB — CULTURE, BLOOD (ROUTINE X 2)
Culture: NO GROWTH
Culture: NO GROWTH
Special Requests: ADEQUATE
Special Requests: ADEQUATE

## 2019-04-07 NOTE — ED Triage Notes (Signed)
Pt coming from home with complaints of shob, tremors, and high CO2. Recently D/C from [place] on the 26th. Pt has hx of DM, A-fib, COPD. Wears 3L [place] chronically. 92% on 3L, placed on nonrebreather then increased to 100%. Pt 69 on capnography. 180/94, HR 92-140, RR 26, CBG 246

## 2019-04-07 NOTE — ED Provider Notes (Signed)
[facility] EMERGENCY DEPARTMENT Provider Note   CSN: [id number] Arrival date & time: 04/07/19  2345     History   Chief Complaint Chief Complaint  Patient presents with  . Shortness of Breath    HPI Eddie Rivera is a 73 y.o. male.     The history is provided by the patient and medical records.     73 y.o. M with hx of COPD on baseline 3L, OSA, AFIB on ASA, presenting to the ED for SOB.  Patient was intubated and admitted in ICU from 04/02/19- 04/05/19 due to respiratory failure and obtunded state, felt to be possibly related to excessive ambien intake after mother passed away.  He returns today due to continued SOB.  He is on 3L O2 at baseline, but states he still feels like he is struggling to breath and can barely get around.  He states when he gets SOB like this he starts having tremors in his hands, has gotten worse over the past 2 days.  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  States he has felt very fatigued with low energy, no frank confusion.  He does feel some "tingling" in his chest.  No diaphoresis.  No fevers, does have chronic cough.  On EMS arrival, he was 92% on his baseline 3L, increased to NRB and went up to 100%.  CO2 was noted to be 69 on their capnography.  Past Medical History:  Diagnosis Date  . COPD (chronic obstructive pulmonary disease) ([place])   . OSA (obstructive sleep apnea)     Patient Active Problem List   Diagnosis Date Noted  . COPD (chronic obstructive pulmonary disease) ([place]) 04/04/2019  . Acute on chronic respiratory failure with hypoxia and hypercapnia ([place]) 04/04/2019  . Crohn's colitis ([place]) 04/04/2019  . Atrial fibrillation with RVR ([place]) 04/04/2019  . Encephalopathy acute 04/02/2019  . Hypoxemic respiratory failure, chronic ([place]) 04/02/2019  . Respiratory failure ([place]) 04/02/2019         Home Medications    Prior to Admission medications   Medication Sig Start Date End  Date Taking? Authorizing Provider  albuterol (PROAIR HFA) 108 (90 Base) MCG/ACT inhaler Inhale 2 puffs into the lungs every 6 (six) hours as needed for wheezing or shortness of breath.    [provider]  albuterol (PROVENTIL) (2.5 MG/3ML) 0.083% nebulizer solution Take 2.5 mg by nebulization See admin instructions. Nebulize 1 vial every 4-6 hours as needed for shortness of breath or wheezing    [provider]  aspirin EC 325 MG tablet Take 325 mg by mouth daily.    [provider]  budesonide-formoterol (SYMBICORT) 160-4.5 MCG/ACT inhaler Inhale 2 puffs into the lungs 2 (two) times daily.    [provider]  carvedilol (COREG) 12.5 MG tablet Take 12.5 mg by mouth 2 (two) times daily with a meal.    [provider]  cetirizine (ZYRTEC) 10 MG tablet Take 10 mg by mouth daily.    [provider]  clotrimazole (LOTRIMIN) 1 % cream Apply 1 application topically See admin instructions. Apply a small amount daily as directed/as needed to legs and feet    [provider]  fluticasone (FLONASE) 50 MCG/ACT nasal spray Place 2 sprays into both nostrils daily.    [provider]  gabapentin (NEURONTIN) 400 MG capsule Take 400 mg by mouth 2 (two) times daily.     [provider]  loperamide (IMODIUM) 2 MG capsule Take  4 mg by mouth 2 (two) times daily as needed for diarrhea or loose stools.    [provider]  mesalamine (LIALDA) 1.2 g EC tablet Take 2.4 g by mouth daily with breakfast.    [provider]  omeprazole (PRILOSEC) 40 MG capsule Take 40 mg by mouth daily before breakfast.     [provider]  tiotropium (SPIRIVA) 18 MCG inhalation capsule Place 18 mcg into inhaler and inhale daily.    [provider]    Family History No family history on file.  Social History Social History   Tobacco Use  . Smoking status: Not on file  Substance Use Topics  . Alcohol use: Not on file  .  Drug use: Not on file     Allergies   Atorvastatin   Review of Systems Review of Systems  Respiratory: Positive for cough and shortness of breath.   Cardiovascular: Positive for chest pain.  All other systems reviewed and are negative.    Physical Exam Updated Vital Signs BP (!) 145/79 (BP Location: Right Arm)   Pulse (!) 123   Temp 98.8 F (37.1 C) (Oral)   Resp (!) 24   SpO2 98%   Physical Exam Vitals signs and nursing note reviewed.  Constitutional:      Appearance: He is well-developed.     Comments: obese  HENT:     Head: Normocephalic and atraumatic.  Eyes:     Conjunctiva/sclera: Conjunctivae normal.     Pupils: Pupils are equal, round, and reactive to light.  Neck:     Musculoskeletal: Normal range of motion.  Cardiovascular:     Rate and Rhythm: Tachycardia present. Rhythm irregularly irregular.     Heart sounds: Normal heart sounds.     Comments: Rate 120's Pulmonary:     Effort: Retractions present.     Breath sounds: Normal breath sounds.     Comments: Increased WOB, retractions noted, speaking in short phrases but appears winded when doing so, 3L O2 in use currently Abdominal:     General: Bowel sounds are normal.     Palpations: Abdomen is soft.  Musculoskeletal: Normal range of motion.  Skin:    General: Skin is warm and dry.  Neurological:     Mental Status: He is alert and oriented to person, place, and time.     Comments: Awake, alert, oriented x3, slight tremor noted to extremities when arms out in front but this is intermittent, no focal strength/sensory deficit      ED Treatments / Results  Labs (all labs ordered are listed, but only abnormal results are displayed) Labs Reviewed  CBC WITH DIFFERENTIAL/PLATELET - Abnormal; Notable for the following components:      Result Value   WBC 12.9 (*)    MCV 103.5 (*)    MCHC 28.3 (*)    Neutro Abs 9.5 (*)    Monocytes Absolute 1.7 (*)    All other components within normal limits  BASIC  METABOLIC PANEL - Abnormal; Notable for the following components:   Chloride 97 (*)    CO2 35 (*)    Glucose, Bld 139 (*)    Calcium 8.8 (*)    All other components within normal limits  POCT I-STAT 7, (LYTES, BLD GAS, ICA,H+H) - Abnormal; Notable for the following components:   pH, Arterial 7.245 (*)    pCO2 arterial 92.1 (*)    pO2, Arterial 44.0 (*)    Bicarbonate 39.9 (*)    TCO2 43 (*)  Acid-Base Excess 8.0 (*)    All other components within normal limits  POCT I-STAT 7, (LYTES, BLD GAS, ICA,H+H) - Abnormal; Notable for the following components:   pH, Arterial 7.305 (*)    pCO2 arterial 78.1 (*)    pO2, Arterial 233.0 (*)    Bicarbonate 38.9 (*)    TCO2 41 (*)    Acid-Base Excess 9.0 (*)    All other components within normal limits  SARS CORONAVIRUS 2 (HOSPITAL ORDER, [place] [facility])  ETHANOL  RAPID URINE DRUG SCREEN, HOSP PERFORMED  COMPREHENSIVE METABOLIC PANEL  CBC  I-STAT ARTERIAL BLOOD GAS, ED  TROPONIN I (HIGH SENSITIVITY)    EKG EKG Interpretation  Date/Time:  Wednesday April 08 2019 00:00:01 EDT Ventricular Rate:  121 PR Interval:    QRS Duration: 92 QT Interval:  296 QTC Calculation: 420 R Axis:   120 Text Interpretation:  Atrial fibrillation Right axis deviation Confirmed by Thayer Jew [phone]) on 04/08/2019 12:06:09 AM   Radiology Dg Chest Port 1 View  Result Date: 04/08/2019 CLINICAL DATA:  Shortness of breath EXAM: PORTABLE CHEST 1 VIEW COMPARISON:  04/02/2019 FINDINGS: Right lower lobe opacity, likely reflecting a combination of atelectasis and right pleural effusion, mildly progressive. No frank interstitial edema.  No pneumothorax. Cardiomegaly. IMPRESSION: Right lower lobe opacity, likely reflecting a combination of atelectasis and right pleural effusion, mildly progressive. Electronically Signed   By: Julian Hy M.D.   On: 04/08/2019 00:35    Procedures Procedures (including critical care time)  CRITICAL  CARE Performed by: Larene Pickett   Total critical care time: 45 minutes  Critical care time was exclusive of separately billable procedures and treating other patients.  Critical care was necessary to treat or prevent imminent or life-threatening deterioration.  Critical care was time spent personally by me on the following activities: development of treatment plan with patient and/or surrogate as well as nursing, discussions with consultants, evaluation of patient's response to treatment, examination of patient, obtaining history from patient or surrogate, ordering and performing treatments and interventions, ordering and review of laboratory studies, ordering and review of radiographic studies, pulse oximetry and re-evaluation of patient's condition.   Medications Ordered in ED Medications  enoxaparin (LOVENOX) injection 40 mg (has no administration in time range)  sodium chloride flush (NS) 0.9 % injection 3 mL (has no administration in time range)  sodium chloride flush (NS) 0.9 % injection 3 mL (has no administration in time range)  0.9 %  sodium chloride infusion (has no administration in time range)  acetaminophen (TYLENOL) tablet 650 mg (has no administration in time range)    Or  acetaminophen (TYLENOL) suppository 650 mg (has no administration in time range)     Initial Impression / Assessment and Plan / ED Course  I have reviewed the triage vital signs and the nursing notes.  Pertinent labs & imaging results that were available during my care of the patient were reviewed by me and considered in my medical decision making (see chart for details).  73 year old male here with shortness of breath.  He was admitted/intubated in ICU from 7/23-7/26 due to chronic respiratory failure and obtunded state, possibly due to Ambien overuse following the death of his mother.  States since discharge he has continued to decline again becoming increasingly more short of breath to the point  where he is having trouble just getting around the house.  States he feels extremely winded.  When he becomes short of breath like this  he gets tremors in his hands and they have gotten worse over the past 2 days.  On EMS arrival he was 92% on his baseline 3 L, increased to 100% with nonrebreather.  Denies any drug or alcohol abuse, reports he has been sober since 2014.  He was noted to be hypercapnic on EMS capnography.  We will plan for screening labs, COVID swab, blood gas, portable chest x-ray.  He likely will require repeat admission.  He is in A. fib with rate in the 120s, he has history of same.  His rapid rate is likely due to his respiratory status.  States he has been complaint with medications.  ABG with PCO2 of 92.  Remainder of labs are overall reassuring.  Chest x-ray with findings of likely atelectasis and right pleural effusion, this is not grossly changed from prior films during last admission.  Discussion with respiratory about initiating BiPAP as he has had a negative COVID test in the last week and does not have any fever or other symptoms concerning for COVID at this time.  They are agreeable, repeat swab is pending at this time.  We will plan for repeat admission.  2:53 AM COVID is negative.  Repeat ABG with improved CO2 of 78.  Tolerating bipap well at this time  Discussed with Dr. Maudie Mercury-- will admit to SDU.  Final Clinical Impressions(s) / ED Diagnoses   Final diagnoses:  Acute on chronic respiratory failure with hypercapnia [facility])    ED Discharge Orders    None       Larene Pickett, PA-C 04/08/19 [id number]    Merryl Hacker, MD 04/08/19 [phone]

## 2019-04-08 ENCOUNTER — Emergency Department (HOSPITAL_COMMUNITY): Payer: No Typology Code available for payment source

## 2019-04-08 ENCOUNTER — Encounter (HOSPITAL_COMMUNITY): Payer: Self-pay | Admitting: Internal Medicine

## 2019-04-08 ENCOUNTER — Inpatient Hospital Stay (HOSPITAL_COMMUNITY): Payer: No Typology Code available for payment source

## 2019-04-08 DIAGNOSIS — I110 Hypertensive heart disease with heart failure: Secondary | ICD-10-CM | POA: Diagnosis present

## 2019-04-08 DIAGNOSIS — E669 Obesity, unspecified: Secondary | ICD-10-CM | POA: Diagnosis present

## 2019-04-08 DIAGNOSIS — G4733 Obstructive sleep apnea (adult) (pediatric): Secondary | ICD-10-CM | POA: Diagnosis present

## 2019-04-08 DIAGNOSIS — J441 Chronic obstructive pulmonary disease with (acute) exacerbation: Secondary | ICD-10-CM | POA: Diagnosis not present

## 2019-04-08 DIAGNOSIS — K219 Gastro-esophageal reflux disease without esophagitis: Secondary | ICD-10-CM | POA: Diagnosis present

## 2019-04-08 DIAGNOSIS — E1165 Type 2 diabetes mellitus with hyperglycemia: Secondary | ICD-10-CM | POA: Diagnosis present

## 2019-04-08 DIAGNOSIS — I5033 Acute on chronic diastolic (congestive) heart failure: Secondary | ICD-10-CM | POA: Diagnosis present

## 2019-04-08 DIAGNOSIS — F1010 Alcohol abuse, uncomplicated: Secondary | ICD-10-CM | POA: Diagnosis present

## 2019-04-08 DIAGNOSIS — J9621 Acute and chronic respiratory failure with hypoxia: Secondary | ICD-10-CM | POA: Diagnosis present

## 2019-04-08 DIAGNOSIS — Z20828 Contact with and (suspected) exposure to other viral communicable diseases: Secondary | ICD-10-CM | POA: Diagnosis present

## 2019-04-08 DIAGNOSIS — Z9111 Patient's noncompliance with dietary regimen: Secondary | ICD-10-CM | POA: Diagnosis not present

## 2019-04-08 DIAGNOSIS — J9811 Atelectasis: Secondary | ICD-10-CM | POA: Diagnosis present

## 2019-04-08 DIAGNOSIS — Z888 Allergy status to other drugs, medicaments and biological substances status: Secondary | ICD-10-CM | POA: Diagnosis not present

## 2019-04-08 DIAGNOSIS — K50119 Crohn's disease of large intestine with unspecified complications: Secondary | ICD-10-CM | POA: Diagnosis not present

## 2019-04-08 DIAGNOSIS — I4891 Unspecified atrial fibrillation: Secondary | ICD-10-CM | POA: Diagnosis not present

## 2019-04-08 DIAGNOSIS — J189 Pneumonia, unspecified organism: Secondary | ICD-10-CM | POA: Diagnosis present

## 2019-04-08 DIAGNOSIS — E873 Alkalosis: Secondary | ICD-10-CM | POA: Diagnosis present

## 2019-04-08 DIAGNOSIS — Z85118 Personal history of other malignant neoplasm of bronchus and lung: Secondary | ICD-10-CM | POA: Diagnosis not present

## 2019-04-08 DIAGNOSIS — R918 Other nonspecific abnormal finding of lung field: Secondary | ICD-10-CM | POA: Diagnosis not present

## 2019-04-08 DIAGNOSIS — Z6836 Body mass index (BMI) 36.0-36.9, adult: Secondary | ICD-10-CM | POA: Diagnosis not present

## 2019-04-08 DIAGNOSIS — J440 Chronic obstructive pulmonary disease with acute lower respiratory infection: Secondary | ICD-10-CM | POA: Diagnosis present

## 2019-04-08 DIAGNOSIS — Z9981 Dependence on supplemental oxygen: Secondary | ICD-10-CM | POA: Diagnosis not present

## 2019-04-08 DIAGNOSIS — Z87891 Personal history of nicotine dependence: Secondary | ICD-10-CM | POA: Diagnosis not present

## 2019-04-08 DIAGNOSIS — J9622 Acute and chronic respiratory failure with hypercapnia: Secondary | ICD-10-CM | POA: Diagnosis present

## 2019-04-08 DIAGNOSIS — R0602 Shortness of breath: Secondary | ICD-10-CM | POA: Diagnosis not present

## 2019-04-08 DIAGNOSIS — I4819 Other persistent atrial fibrillation: Secondary | ICD-10-CM | POA: Diagnosis not present

## 2019-04-08 DIAGNOSIS — I10 Essential (primary) hypertension: Secondary | ICD-10-CM

## 2019-04-08 DIAGNOSIS — I361 Nonrheumatic tricuspid (valve) insufficiency: Secondary | ICD-10-CM

## 2019-04-08 DIAGNOSIS — J9690 Respiratory failure, unspecified, unspecified whether with hypoxia or hypercapnia: Secondary | ICD-10-CM | POA: Diagnosis not present

## 2019-04-08 DIAGNOSIS — K5010 Crohn's disease of large intestine without complications: Secondary | ICD-10-CM | POA: Diagnosis present

## 2019-04-08 DIAGNOSIS — D649 Anemia, unspecified: Secondary | ICD-10-CM | POA: Diagnosis present

## 2019-04-08 DIAGNOSIS — R0600 Dyspnea, unspecified: Secondary | ICD-10-CM | POA: Diagnosis present

## 2019-04-08 LAB — SARS CORONAVIRUS 2 BY RT PCR (HOSPITAL ORDER, PERFORMED IN ~~LOC~~ HOSPITAL LAB): SARS Coronavirus 2: NEGATIVE

## 2019-04-08 LAB — RAPID URINE DRUG SCREEN, HOSP PERFORMED
Amphetamines: NOT DETECTED
Barbiturates: NOT DETECTED
Benzodiazepines: NOT DETECTED
Cocaine: NOT DETECTED
Opiates: NOT DETECTED
Tetrahydrocannabinol: NOT DETECTED

## 2019-04-08 LAB — POCT I-STAT 7, (LYTES, BLD GAS, ICA,H+H)
Acid-Base Excess: 8 mmol/L — ABNORMAL HIGH (ref 0.0–2.0)
Acid-Base Excess: 9 mmol/L — ABNORMAL HIGH (ref 0.0–2.0)
Bicarbonate: 38.9 mmol/L — ABNORMAL HIGH (ref 20.0–28.0)
Bicarbonate: 39.9 mmol/L — ABNORMAL HIGH (ref 20.0–28.0)
Calcium, Ion: 1.23 mmol/L (ref 1.15–1.40)
Calcium, Ion: 1.27 mmol/L (ref 1.15–1.40)
HCT: 44 % (ref 39.0–52.0)
HCT: 45 % (ref 39.0–52.0)
Hemoglobin: 15 g/dL (ref 13.0–17.0)
Hemoglobin: 15.3 g/dL (ref 13.0–17.0)
O2 Saturation: 100 %
O2 Saturation: 68 %
Patient temperature: 98.6
Patient temperature: 98.8
Potassium: 4.4 mmol/L (ref 3.5–5.1)
Potassium: 4.5 mmol/L (ref 3.5–5.1)
Sodium: 136 mmol/L (ref 135–145)
Sodium: 139 mmol/L (ref 135–145)
TCO2: 41 mmol/L — ABNORMAL HIGH (ref 22–32)
TCO2: 43 mmol/L — ABNORMAL HIGH (ref 22–32)
pCO2 arterial: 78.1 mmHg (ref 32.0–48.0)
pCO2 arterial: 92.1 mmHg (ref 32.0–48.0)
pH, Arterial: 7.245 — ABNORMAL LOW (ref 7.350–7.450)
pH, Arterial: 7.305 — ABNORMAL LOW (ref 7.350–7.450)
pO2, Arterial: 233 mmHg — ABNORMAL HIGH (ref 83.0–108.0)
pO2, Arterial: 44 mmHg — ABNORMAL LOW (ref 83.0–108.0)

## 2019-04-08 LAB — CBC
HCT: 47.8 % (ref 39.0–52.0)
Hemoglobin: 13.5 g/dL (ref 13.0–17.0)
MCH: 29.2 pg (ref 26.0–34.0)
MCHC: 28.2 g/dL — ABNORMAL LOW (ref 30.0–36.0)
MCV: 103.5 fL — ABNORMAL HIGH (ref 80.0–100.0)
Platelets: 235 10*3/uL (ref 150–400)
RBC: 4.62 MIL/uL (ref 4.22–5.81)
RDW: 14.9 % (ref 11.5–15.5)
WBC: 12.7 10*3/uL — ABNORMAL HIGH (ref 4.0–10.5)
nRBC: 0 % (ref 0.0–0.2)

## 2019-04-08 LAB — COMPREHENSIVE METABOLIC PANEL
ALT: 30 U/L (ref 0–44)
AST: 24 U/L (ref 15–41)
Albumin: 3.4 g/dL — ABNORMAL LOW (ref 3.5–5.0)
Alkaline Phosphatase: 79 U/L (ref 38–126)
Anion gap: 8 (ref 5–15)
BUN: 8 mg/dL (ref 8–23)
CO2: 35 mmol/L — ABNORMAL HIGH (ref 22–32)
Calcium: 8.8 mg/dL — ABNORMAL LOW (ref 8.9–10.3)
Chloride: 97 mmol/L — ABNORMAL LOW (ref 98–111)
Creatinine, Ser: 0.93 mg/dL (ref 0.61–1.24)
GFR calc Af Amer: >60 mL/min (ref >60)
GFR calc non Af Amer: >60 mL/min (ref >60)
Glucose, Bld: 137 mg/dL — ABNORMAL HIGH (ref 70–99)
Potassium: 5.1 mmol/L (ref 3.5–5.1)
Sodium: 140 mmol/L (ref 135–145)
Total Bilirubin: 1 mg/dL (ref 0.3–1.2)
Total Protein: 7 g/dL (ref 6.5–8.1)

## 2019-04-08 LAB — GLUCOSE, CAPILLARY
Glucose-Capillary: 254 mg/dL — ABNORMAL HIGH (ref 70–99)
Glucose-Capillary: 182 mg/dL — ABNORMAL HIGH (ref 70–99)

## 2019-04-08 LAB — CBC WITH DIFFERENTIAL/PLATELET
Abs Immature Granulocytes: 0.07 10*3/uL (ref 0.00–0.07)
Basophils Absolute: 0 10*3/uL (ref 0.0–0.1)
Basophils Relative: 0 %
Eosinophils Absolute: 0.1 10*3/uL (ref 0.0–0.5)
Eosinophils Relative: 1 %
HCT: 46.7 % (ref 39.0–52.0)
Hemoglobin: 13.2 g/dL (ref 13.0–17.0)
Immature Granulocytes: 1 %
Lymphocytes Relative: 12 %
Lymphs Abs: 1.5 10*3/uL (ref 0.7–4.0)
MCH: 29.3 pg (ref 26.0–34.0)
MCHC: 28.3 g/dL — ABNORMAL LOW (ref 30.0–36.0)
MCV: 103.5 fL — ABNORMAL HIGH (ref 80.0–100.0)
Monocytes Absolute: 1.7 10*3/uL — ABNORMAL HIGH (ref 0.1–1.0)
Monocytes Relative: 13 %
Neutro Abs: 9.5 10*3/uL — ABNORMAL HIGH (ref 1.7–7.7)
Neutrophils Relative %: 73 %
Platelets: 220 10*3/uL (ref 150–400)
RBC: 4.51 MIL/uL (ref 4.22–5.81)
RDW: 15 % (ref 11.5–15.5)
WBC: 12.9 10*3/uL — ABNORMAL HIGH (ref 4.0–10.5)
nRBC: 0 % (ref 0.0–0.2)

## 2019-04-08 LAB — BASIC METABOLIC PANEL
Anion gap: 7 (ref 5–15)
BUN: 11 mg/dL (ref 8–23)
CO2: 35 mmol/L — ABNORMAL HIGH (ref 22–32)
Calcium: 8.8 mg/dL — ABNORMAL LOW (ref 8.9–10.3)
Chloride: 97 mmol/L — ABNORMAL LOW (ref 98–111)
Creatinine, Ser: 0.84 mg/dL (ref 0.61–1.24)
GFR calc Af Amer: >60 mL/min (ref >60)
GFR calc non Af Amer: >60 mL/min (ref >60)
Glucose, Bld: 139 mg/dL — ABNORMAL HIGH (ref 70–99)
Potassium: 5 mmol/L (ref 3.5–5.1)
Sodium: 139 mmol/L (ref 135–145)

## 2019-04-08 LAB — TROPONIN I (HIGH SENSITIVITY)
Troponin I (High Sensitivity): 7 ng/L (ref <18)
Troponin I (High Sensitivity): 8 ng/L (ref <18)
Troponin I (High Sensitivity): 7 ng/L (ref <18)

## 2019-04-08 LAB — ETHANOL: Alcohol, Ethyl (B): <10 mg/dL (ref <10)

## 2019-04-08 LAB — ECHOCARDIOGRAM COMPLETE

## 2019-04-08 LAB — BRAIN NATRIURETIC PEPTIDE: B Natriuretic Peptide: 110.5 pg/mL — ABNORMAL HIGH (ref 0.0–100.0)

## 2019-04-08 MED ORDER — MESALAMINE 1.2 G PO TBEC
2.4000 g | DELAYED_RELEASE_TABLET | Freq: Every day | ORAL | Status: DC
  Administered 2019-04-09 – 2019-04-12 (×4): 2.4 g via ORAL
  Filled 2019-04-08 (×6): qty 2

## 2019-04-08 MED ORDER — METHYLPREDNISOLONE SODIUM SUCC 125 MG IJ SOLR
80.0000 mg | Freq: Three times a day (TID) | INTRAMUSCULAR | Status: DC
  Administered 2019-04-08 – 2019-04-09 (×5): 80 mg via INTRAVENOUS
  Filled 2019-04-08 (×5): qty 2

## 2019-04-08 MED ORDER — ACETAMINOPHEN 650 MG RE SUPP: 650.0000 mg | Freq: Four times a day (QID) | RECTAL | Status: DC | PRN

## 2019-04-08 MED ORDER — FLUTICASONE PROPIONATE 50 MCG/ACT NA SUSP
2.0000 | Freq: Every day | NASAL | Status: DC
  Administered 2019-04-11 – 2019-04-12 (×2): 2 via NASAL
  Filled 2019-04-08 (×2): qty 16

## 2019-04-08 MED ORDER — LOPERAMIDE HCL 2 MG PO CAPS
4.0000 mg | ORAL_CAPSULE | Freq: Two times a day (BID) | ORAL | Status: DC | PRN
  Administered 2019-04-08: 4 mg via ORAL
  Filled 2019-04-08: qty 2

## 2019-04-08 MED ORDER — SODIUM CHLORIDE 0.9% FLUSH
3.0000 mL | INTRAVENOUS | Status: DC | PRN
  Administered 2019-04-08: 3 mL via INTRAVENOUS

## 2019-04-08 MED ORDER — SODIUM CHLORIDE 0.9 % IV SOLN
500.0000 mg | INTRAVENOUS | Status: DC
  Administered 2019-04-08 – 2019-04-12 (×5): 500 mg via INTRAVENOUS
  Filled 2019-04-08 (×6): qty 500

## 2019-04-08 MED ORDER — MOMETASONE FURO-FORMOTEROL FUM 200-5 MCG/ACT IN AERO
2.0000 | INHALATION_SPRAY | Freq: Two times a day (BID) | RESPIRATORY_TRACT | Status: DC
  Administered 2019-04-09 (×2): 2 via RESPIRATORY_TRACT
  Filled 2019-04-08: qty 8.8

## 2019-04-08 MED ORDER — LEVALBUTEROL HCL 0.63 MG/3ML IN NEBU
0.6300 mg | INHALATION_SOLUTION | Freq: Four times a day (QID) | RESPIRATORY_TRACT | Status: DC
  Administered 2019-04-08 (×2): 0.63 mg via RESPIRATORY_TRACT
  Filled 2019-04-08 (×2): qty 3

## 2019-04-08 MED ORDER — ENOXAPARIN SODIUM 40 MG/0.4ML ~~LOC~~ SOLN
40.0000 mg | SUBCUTANEOUS | Status: DC
  Administered 2019-04-09: 40 mg via SUBCUTANEOUS
  Filled 2019-04-08: qty 0.4

## 2019-04-08 MED ORDER — SODIUM CHLORIDE 0.9% FLUSH
3.0000 mL | Freq: Two times a day (BID) | INTRAVENOUS | Status: DC
  Administered 2019-04-08 – 2019-04-12 (×7): 3 mL via INTRAVENOUS

## 2019-04-08 MED ORDER — FUROSEMIDE 10 MG/ML IJ SOLN
40.0000 mg | Freq: Once | INTRAMUSCULAR | Status: AC
  Administered 2019-04-08: 40 mg via INTRAVENOUS
  Filled 2019-04-08: qty 4

## 2019-04-08 MED ORDER — ONDANSETRON HCL 4 MG/2ML IJ SOLN: 4.0000 mg | Freq: Four times a day (QID) | INTRAMUSCULAR | Status: DC | PRN

## 2019-04-08 MED ORDER — LORATADINE 10 MG PO TABS
10.0000 mg | ORAL_TABLET | Freq: Every day | ORAL | Status: DC
  Administered 2019-04-08 – 2019-04-11 (×4): 10 mg via ORAL
  Filled 2019-04-08 (×5): qty 1

## 2019-04-08 MED ORDER — ACETAMINOPHEN 325 MG PO TABS: 650.0000 mg | ORAL_TABLET | Freq: Four times a day (QID) | ORAL | Status: DC | PRN

## 2019-04-08 MED ORDER — ALBUTEROL SULFATE (2.5 MG/3ML) 0.083% IN NEBU
2.5000 mg | INHALATION_SOLUTION | Freq: Four times a day (QID) | RESPIRATORY_TRACT | Status: DC
  Administered 2019-04-08 (×2): 2.5 mg via RESPIRATORY_TRACT
  Filled 2019-04-08 (×2): qty 3

## 2019-04-08 MED ORDER — PANTOPRAZOLE SODIUM 40 MG PO TBEC
40.0000 mg | DELAYED_RELEASE_TABLET | Freq: Every day | ORAL | Status: DC
  Administered 2019-04-08 – 2019-04-12 (×5): 40 mg via ORAL
  Filled 2019-04-08 (×5): qty 1

## 2019-04-08 MED ORDER — GABAPENTIN 400 MG PO CAPS
400.0000 mg | ORAL_CAPSULE | Freq: Two times a day (BID) | ORAL | Status: DC
  Administered 2019-04-08 – 2019-04-12 (×9): 400 mg via ORAL
  Filled 2019-04-08 (×9): qty 1

## 2019-04-08 MED ORDER — ALBUTEROL SULFATE (2.5 MG/3ML) 0.083% IN NEBU: 2.5000 mg | INHALATION_SOLUTION | Freq: Four times a day (QID) | RESPIRATORY_TRACT | Status: DC | PRN

## 2019-04-08 MED ORDER — SODIUM CHLORIDE 0.9 % IV SOLN: 250.0000 mL | INTRAVENOUS | Status: DC | PRN

## 2019-04-08 MED ORDER — GUAIFENESIN ER 600 MG PO TB12
1200.0000 mg | ORAL_TABLET | Freq: Two times a day (BID) | ORAL | Status: DC
  Administered 2019-04-08 – 2019-04-12 (×9): 1200 mg via ORAL
  Filled 2019-04-08 (×9): qty 2

## 2019-04-08 MED ORDER — IPRATROPIUM BROMIDE 0.02 % IN SOLN
0.5000 mg | Freq: Four times a day (QID) | RESPIRATORY_TRACT | Status: DC
  Administered 2019-04-08 (×2): 0.5 mg via RESPIRATORY_TRACT
  Filled 2019-04-08 (×2): qty 2.5

## 2019-04-08 MED ORDER — DILTIAZEM HCL-DEXTROSE 100-5 MG/100ML-% IV SOLN (PREMIX)
5.0000 mg/h | INTRAVENOUS | Status: DC
  Administered 2019-04-08 (×2): 5 mg/h via INTRAVENOUS
  Filled 2019-04-08 (×3): qty 100

## 2019-04-08 MED ORDER — ASPIRIN EC 325 MG PO TBEC
325.0000 mg | DELAYED_RELEASE_TABLET | Freq: Every day | ORAL | Status: DC
  Administered 2019-04-08 – 2019-04-09 (×2): 325 mg via ORAL
  Filled 2019-04-08 (×2): qty 1

## 2019-04-08 MED ORDER — CARVEDILOL 12.5 MG PO TABS
12.5000 mg | ORAL_TABLET | Freq: Two times a day (BID) | ORAL | Status: DC
  Administered 2019-04-08 – 2019-04-12 (×8): 12.5 mg via ORAL
  Filled 2019-04-08 (×9): qty 1

## 2019-04-08 NOTE — ED Notes (Signed)
SDU        -breakfast ordered

## 2019-04-08 NOTE — ED Notes (Signed)
Lunch Tray Ordered @ 1101-per RN called by Levada Dy

## 2019-04-08 NOTE — Progress Notes (Signed)
Bipap on standby after patient took off the mask and stating he couldn't breath. Patient currently is on 4L [place] since he refuses to go back on the Bipap.

## 2019-04-08 NOTE — ED Notes (Signed)
ED TO INPATIENT HANDOFF REPORT  ED Nurse Name and Phone #: [phone]   S Name/Age/Gender Eddie Rivera 73 y.o. male Room/Bed: 031C/031C  Code Status   Code Status: Full Code  Home/SNF/Other Home Patient oriented to: self Is this baseline? Yes   Triage Complete: Triage complete  Chief Complaint SOB tremors afib  Triage Note Pt coming from home with complaints of shob, tremors, and high CO2. Recently D/C from [facility] on the 26th. Pt has hx of DM, A-fib, COPD. Wears 3L [place] chronically. 92% on 3L, placed on nonrebreather then increased to 100%. Pt 71 on capnography. 180/94, HR 92-140, RR 26, CBG 246   Allergies Allergies  Allergen Reactions  . Atorvastatin Other (See Comments)    Patient doesn't recall the reaction (??)    Level of Care/Admitting Diagnosis ED Disposition    ED Disposition Condition [facility] Area: [place] [[id number]]  Level of Care: Progressive [102]  Covid Evaluation: Confirmed COVID Negative  Diagnosis: COPD exacerbation [facility]) [[id number]]  Admitting Physician: Jani Gravel [id number]  Attending Physician: Jani Gravel [phone]  Estimated length of stay: past midnight tomorrow  Certification:: I certify this patient will need inpatient services for at least 2 midnights  PT Class (Do Not Modify): Inpatient [101]  PT Acc Code (Do Not Modify): Private [1]       B Medical/Surgery History Past Medical History:  Diagnosis Date  . Atrial fibrillation ([place])   . COPD (chronic obstructive pulmonary disease) ([place])   . Crohn's colitis ([place])   . Hypertension   . Lung cancer ([place])    LLL  . OSA (obstructive sleep apnea)    Past Surgical History:  Procedure Laterality Date  . HEMORRHOID SURGERY       A IV Location/Drains/Wounds Patient Lines/Drains/Airways Status   Active Line/Drains/Airways    Name:   Placement date:   Placement time:   Site:   Days:   Peripheral IV 04/07/19 Right Antecubital   04/07/19    2358     Antecubital   1          Intake/Output Last 24 hours  Intake/Output Summary (Last 24 hours) at 04/08/2019 1453 Last data filed at 04/08/2019 1320 Gross per 24 hour  Intake -  Output 1200 ml  Net -1200 ml    Labs/Imaging Results for orders placed or performed during the hospital encounter of 04/07/19 (from the past 48 hour(s))  CBC with Differential     Status: Abnormal   Collection Time: 04/08/19 12:06 AM  Result Value Ref Range   WBC 12.9 (H) 4.0 - 10.5 K/uL   RBC 4.51 4.22 - 5.81 MIL/uL   Hemoglobin 13.2 13.0 - 17.0 g/dL   HCT 46.7 39.0 - 52.0 %   MCV 103.5 (H) 80.0 - 100.0 fL   MCH 29.3 26.0 - 34.0 pg   MCHC 28.3 (L) 30.0 - 36.0 g/dL   RDW 15.0 11.5 - 15.5 %   Platelets 220 150 - 400 K/uL   nRBC 0.0 0.0 - 0.2 %   Neutrophils Relative % 73 %   Neutro Abs 9.5 (H) 1.7 - 7.7 K/uL   Lymphocytes Relative 12 %   Lymphs Abs 1.5 0.7 - 4.0 K/uL   Monocytes Relative 13 %   Monocytes Absolute 1.7 (H) 0.1 - 1.0 K/uL   Eosinophils Relative 1 %   Eosinophils Absolute 0.1 0.0 - 0.5 K/uL   Basophils Relative 0 %   Basophils Absolute 0.0 0.0 -  0.1 K/uL   Immature Granulocytes 1 %   Abs Immature Granulocytes 0.07 0.00 - 0.07 K/uL    Comment: Performed at [facility], [place] [address], [place], [place] [address]  Basic metabolic panel     Status: Abnormal   Collection Time: 04/08/19 12:06 AM  Result Value Ref Range   Sodium 139 135 - 145 mmol/L   Potassium 5.0 3.5 - 5.1 mmol/L   Chloride 97 (L) 98 - 111 mmol/L   CO2 35 (H) 22 - 32 mmol/L   Glucose, Bld 139 (H) 70 - 99 mg/dL   BUN 11 8 - 23 mg/dL   Creatinine, Ser 0.84 0.61 - 1.24 mg/dL   Calcium 8.8 (L) 8.9 - 10.3 mg/dL   GFR calc non Af Amer >60 >60 mL/min   GFR calc Af Amer >60 >60 mL/min   Anion gap 7 5 - 15    Comment: Performed at [facility], [place] [address] [place], [place] [address]  Ethanol     Status: None   Collection Time: 04/08/19 12:06 AM  Result Value Ref Range   Alcohol, Ethyl (B) <10 <10 mg/dL     Comment: (NOTE) Lowest detectable limit for serum alcohol is 10 mg/dL. For medical purposes only. Performed at [facility], [place] [address], [place], [place] [address]   Troponin I (High Sensitivity)     Status: None   Collection Time: 04/08/19 12:06 AM  Result Value Ref Range   Troponin I (High Sensitivity) 7 <18 ng/L    Comment: (NOTE) Elevated high sensitivity troponin I (hsTnI) values and significant  changes across serial measurements may suggest ACS but many other  chronic and acute conditions are known to elevate hsTnI results.  Refer to the "Links" section for chest pain algorithms and additional  guidance. Performed at [facility], [place] [address], [place], [place] [address]   I-STAT 7, (LYTES, BLD GAS, ICA, H+H)     Status: Abnormal   Collection Time: 04/08/19 12:54 AM  Result Value Ref Range   pH, Arterial 7.245 (L) 7.350 - 7.450   pCO2 arterial 92.1 (HH) 32.0 - 48.0 mmHg   pO2, Arterial 44.0 (L) 83.0 - 108.0 mmHg   Bicarbonate 39.9 (H) 20.0 - 28.0 mmol/L   TCO2 43 (H) 22 - 32 mmol/L   O2 Saturation 68.0 %   Acid-Base Excess 8.0 (H) 0.0 - 2.0 mmol/L   Sodium 139 135 - 145 mmol/L   Potassium 4.4 3.5 - 5.1 mmol/L   Calcium, Ion 1.27 1.15 - 1.40 mmol/L   HCT 44.0 39.0 - 52.0 %   Hemoglobin 15.0 13.0 - 17.0 g/dL   Patient temperature 98.6 F    Collection site RADIAL, ALLEN'S TEST ACCEPTABLE    Drawn by RT    Sample type ARTERIAL    Comment NOTIFIED PHYSICIAN   SARS Coronavirus 2 (CEPHEID - Performed in [place] hospital lab), Hosp Order     Status: None   Collection Time: 04/08/19  1:23 AM   Specimen: Nasopharyngeal Swab  Result Value Ref Range   SARS Coronavirus 2 NEGATIVE NEGATIVE    Comment: (NOTE) If result is NEGATIVE SARS-CoV-2 target nucleic acids are NOT DETECTED. The SARS-CoV-2 RNA is generally detectable in upper and lower  respiratory specimens during the acute phase of infection. The lowest  concentration of SARS-CoV-2 viral  copies this assay can detect is 250  copies / mL. A negative result does not preclude SARS-CoV-2 infection  and should not be used as the sole basis for treatment or other  patient management decisions.  A negative result may occur with  improper specimen collection / handling, submission of specimen other  than nasopharyngeal swab, presence of viral mutation(s) within the  areas targeted by this assay, and inadequate number of viral copies  (<250 copies / mL). A negative result must be combined with clinical  observations, patient history, and epidemiological information. If result is POSITIVE SARS-CoV-2 target nucleic acids are DETECTED. The SARS-CoV-2 RNA is generally detectable in upper and lower  respiratory specimens dur ing the acute phase of infection.  Positive  results are indicative of active infection with SARS-CoV-2.  Clinical  correlation with patient history and other diagnostic information is  necessary to determine patient infection status.  Positive results do  not rule out bacterial infection or co-infection with other viruses. If result is PRESUMPTIVE POSTIVE SARS-CoV-2 nucleic acids MAY BE PRESENT.   A presumptive positive result was obtained on the submitted specimen  and confirmed on repeat testing.  While 2019 novel coronavirus  (SARS-CoV-2) nucleic acids may be present in the submitted sample  additional confirmatory testing may be necessary for epidemiological  and / or clinical management purposes  to differentiate between  SARS-CoV-2 and other Sarbecovirus currently known to infect humans.  If clinically indicated additional testing with an alternate test  methodology [phone]) is advised. The SARS-CoV-2 RNA is generally  detectable in upper and lower respiratory sp ecimens during the acute  phase of infection. The expected result is Negative. Fact Sheet for Patients:  StrictlyIdeas.no Fact Sheet for Healthcare  Providers: BankingDealers.co.za This test is not yet approved or cleared by the [place] FDA and has been authorized for detection and/or diagnosis of SARS-CoV-2 by FDA under an Emergency Use Authorization (EUA).  This EUA will remain in effect (meaning this test can be used) for the duration of the COVID-19 declaration under Section 564(b)(1) of the Act, 21 U.S.C. section 360bbb-3(b)(1), unless the authorization is terminated or revoked sooner. Performed at [facility], [place] [address], [place], [place] [address]   I-STAT 7, (LYTES, BLD GAS, ICA, H+H)     Status: Abnormal   Collection Time: 04/08/19  2:46 AM  Result Value Ref Range   pH, Arterial 7.305 (L) 7.350 - 7.450   pCO2 arterial 78.1 (HH) 32.0 - 48.0 mmHg   pO2, Arterial 233.0 (H) 83.0 - 108.0 mmHg   Bicarbonate 38.9 (H) 20.0 - 28.0 mmol/L   TCO2 41 (H) 22 - 32 mmol/L   O2 Saturation 100.0 %   Acid-Base Excess 9.0 (H) 0.0 - 2.0 mmol/L   Sodium 136 135 - 145 mmol/L   Potassium 4.5 3.5 - 5.1 mmol/L   Calcium, Ion 1.23 1.15 - 1.40 mmol/L   HCT 45.0 39.0 - 52.0 %   Hemoglobin 15.3 13.0 - 17.0 g/dL   Patient temperature 98.8 F    Collection site RADIAL, ALLEN'S TEST ACCEPTABLE    Drawn by RT    Sample type ARTERIAL    Comment NOTIFIED PHYSICIAN   Comprehensive metabolic panel     Status: Abnormal   Collection Time: 04/08/19  4:10 AM  Result Value Ref Range   Sodium 140 135 - 145 mmol/L   Potassium 5.1 3.5 - 5.1 mmol/L   Chloride 97 (L) 98 - 111 mmol/L   CO2 35 (H) 22 - 32 mmol/L   Glucose, Bld 137 (H) 70 - 99 mg/dL   BUN 8 8 -  23 mg/dL   Creatinine, Ser 0.93 0.61 - 1.24 mg/dL   Calcium 8.8 (L) 8.9 - 10.3 mg/dL   Total Protein 7.0 6.5 - 8.1 g/dL   Albumin 3.4 (L) 3.5 - 5.0 g/dL   AST 24 15 - 41 U/L   ALT 30 0 - 44 U/L   Alkaline Phosphatase 79 38 - 126 U/L   Total Bilirubin 1.0 0.3 - 1.2 mg/dL   GFR calc non Af Amer >60 >60 mL/min   GFR calc Af Amer >60 >60 mL/min   Anion gap 8 5 -  15    Comment: Performed at [facility] [address], [place], [place] [address]  CBC     Status: Abnormal   Collection Time: 04/08/19  4:10 AM  Result Value Ref Range   WBC 12.7 (H) 4.0 - 10.5 K/uL   RBC 4.62 4.22 - 5.81 MIL/uL   Hemoglobin 13.5 13.0 - 17.0 g/dL   HCT 47.8 39.0 - 52.0 %   MCV 103.5 (H) 80.0 - 100.0 fL   MCH 29.2 26.0 - 34.0 pg   MCHC 28.2 (L) 30.0 - 36.0 g/dL   RDW 14.9 11.5 - 15.5 %   Platelets 235 150 - 400 K/uL   nRBC 0.0 0.0 - 0.2 %    Comment: Performed at [facility], [place] [address], [place], [place] [address]  Troponin I (High Sensitivity)     Status: None   Collection Time: 04/08/19  4:10 AM  Result Value Ref Range   Troponin I (High Sensitivity) 8 <18 ng/L    Comment: (NOTE) Elevated high sensitivity troponin I (hsTnI) values and significant  changes across serial measurements may suggest ACS but many other  chronic and acute conditions are known to elevate hsTnI results.  Refer to the "Links" section for chest pain algorithms and additional  guidance. Performed at [facility], [place] [address] [place], [place] [address]   Brain natriuretic peptide     Status: Abnormal   Collection Time: 04/08/19  4:10 AM  Result Value Ref Range   B Natriuretic Peptide 110.5 (H) 0.0 - 100.0 pg/mL    Comment: Performed at [facility] [address] [place], [place] [address]  Troponin I (High Sensitivity)     Status: None   Collection Time: 04/08/19  7:20 AM  Result Value Ref Range   Troponin I (High Sensitivity) 7 <18 ng/L    Comment: (NOTE) Elevated high sensitivity troponin I (hsTnI) values and significant  changes across serial measurements may suggest ACS but many other  chronic and acute conditions are known to elevate hsTnI results.  Refer to the "Links" section for chest pain algorithms and additional  guidance. Performed at [facility], [place] [address] [place], [place] [address]   Rapid urine drug screen (hospital  performed)     Status: None   Collection Time: 04/08/19  1:22 PM  Result Value Ref Range   Opiates NONE DETECTED NONE DETECTED   Cocaine NONE DETECTED NONE DETECTED   Benzodiazepines NONE DETECTED NONE DETECTED   Amphetamines NONE DETECTED NONE DETECTED   Tetrahydrocannabinol NONE DETECTED NONE DETECTED   Barbiturates NONE DETECTED NONE DETECTED    Comment: (NOTE) DRUG SCREEN FOR MEDICAL PURPOSES ONLY.  IF CONFIRMATION IS NEEDED FOR ANY PURPOSE, NOTIFY LAB WITHIN 5 DAYS. LOWEST DETECTABLE LIMITS FOR URINE DRUG SCREEN Drug Class  Cutoff (ng/mL) Amphetamine and metabolites    1000 Barbiturate and metabolites    200 Benzodiazepine                 101 Tricyclics and metabolites     300 Opiates and metabolites        300 Cocaine and metabolites        300 THC                            50 Performed at [facility], [place] [address], [place], [place] [address]    Dg Chest Port 1 View  Result Date: 04/08/2019 CLINICAL DATA:  Shortness of breath EXAM: PORTABLE CHEST 1 VIEW COMPARISON:  04/02/2019 FINDINGS: Right lower lobe opacity, likely reflecting a combination of atelectasis and right pleural effusion, mildly progressive. No frank interstitial edema.  No pneumothorax. Cardiomegaly. IMPRESSION: Right lower lobe opacity, likely reflecting a combination of atelectasis and right pleural effusion, mildly progressive. Electronically Signed   By: Julian Hy M.D.   On: 04/08/2019 00:35    Pending Labs Unresulted Labs (From admission, onward)    Start     Ordered   04/15/19 0500  Creatinine, serum  (enoxaparin (LOVENOX)    CrCl >/= 30 ml/min)  Weekly,   R    Comments: while on enoxaparin therapy    04/08/19 0311   04/09/19 0500  Magnesium  Tomorrow morning,   R     04/08/19 1027   04/09/19 0500  Phosphorus  Tomorrow morning,   R     04/08/19 1027   04/09/19 0500  CBC with Differential/Platelet  Tomorrow morning,   R     04/08/19 1027   04/09/19 0500   Comprehensive metabolic panel  Tomorrow morning,   R     04/08/19 1027          Vitals/Pain Today's Vitals   04/08/19 1100 04/08/19 1200 04/08/19 1324 04/08/19 1326  BP: 102/72 (!) 126/56  (!) 126/56  Pulse: 97 (!) 105  86  Resp: (!) 21 (!) 26  (!) 31  Temp:      TempSrc:      SpO2: 97% 91%  91%  PainSc:   0-No pain     Isolation Precautions No active isolations  Medications Medications  enoxaparin (LOVENOX) injection 40 mg (40 mg Subcutaneous Not Given 04/08/19 0507)  sodium chloride flush (NS) 0.9 % injection 3 mL (3 mLs Intravenous Not Given 04/08/19 1204)  sodium chloride flush (NS) 0.9 % injection 3 mL (3 mLs Intravenous Given 04/08/19 0351)  0.9 %  sodium chloride infusion (has no administration in time range)  acetaminophen (TYLENOL) tablet 650 mg (has no administration in time range)    Or  acetaminophen (TYLENOL) suppository 650 mg (has no administration in time range)  methylPREDNISolone sodium succinate (SOLU-MEDROL) 125 mg/2 mL injection 80 mg (80 mg Intravenous Given 04/08/19 1214)  azithromycin (ZITHROMAX) 500 mg in sodium chloride 0.9 % 250 mL IVPB (0 mg Intravenous Stopped 04/08/19 0506)  albuterol (PROVENTIL) (2.5 MG/3ML) 0.083% nebulizer solution 2.5 mg (has no administration in time range)  aspirin EC tablet 325 mg (325 mg Oral Given 04/08/19 1048)  carvedilol (COREG) tablet 12.5 mg (has no administration in time range)  loperamide (IMODIUM) capsule 4 mg (4 mg Oral Given 04/08/19 1213)  mesalamine (LIALDA) EC tablet 2.4 g (has no administration in time range)  pantoprazole (PROTONIX) EC tablet 40 mg (40 mg Oral Given  04/08/19 1048)  gabapentin (NEURONTIN) capsule 400 mg (400 mg Oral Given 04/08/19 1055)  mometasone-formoterol (DULERA) 200-5 MCG/ACT inhaler 2 puff (2 puffs Inhalation Not Given 04/08/19 1026)  loratadine (CLARITIN) tablet 10 mg (10 mg Oral Given 04/08/19 1048)  fluticasone (FLONASE) 50 MCG/ACT nasal spray 2 spray (2 sprays Each Nare Refused 04/08/19  1320)  diltiazem (CARDIZEM) 100 mg in dextrose 5% 168mL (1 mg/mL) infusion (5 mg/hr Intravenous Rate/Dose Change 04/08/19 1042)  ondansetron (ZOFRAN) injection 4 mg (has no administration in time range)  levalbuterol (XOPENEX) nebulizer solution 0.63 mg (0.63 mg Nebulization Given 04/08/19 1026)  ipratropium (ATROVENT) nebulizer solution 0.5 mg (0.5 mg Nebulization Given 04/08/19 1026)  guaiFENesin (MUCINEX) 12 hr tablet 1,200 mg (1,200 mg Oral Given 04/08/19 1054)  furosemide (LASIX) injection 40 mg (40 mg Intravenous Given 04/08/19 1050)    Mobility walks with device Moderate fall risk   Focused Assessments Cardiac Assessment Handoff:    No results found for: CKTOTAL, CKMB, CKMBINDEX, TROPONINI No results found for: DDIMER Does the Patient currently have chest pain? No      R Recommendations: See Admitting Provider Note  Report given to:   Additional Notes: CARDIZEM DRIP @5MG 

## 2019-04-08 NOTE — ED Notes (Signed)
Respiratory at bedside to place patient on BIPAP

## 2019-04-08 NOTE — Progress Notes (Signed)
2D Echocardiogram has been performed.  Eddie Rivera 04/08/2019, 2:11 PM

## 2019-04-08 NOTE — ED Notes (Signed)
Patient states he is ready to go home , strongly encouraged patient to relax and stay, took himself off Bipap and resp placed on 4 liters [place].

## 2019-04-08 NOTE — ED Notes (Signed)
Patient is constantly calling out for nurse, taking himself off the monitor and o2, again encouraged patient to please not remove equipment.

## 2019-04-08 NOTE — ED Notes (Signed)
Patient accidentally spilled urine on stretcher , linens changed and patient repositioned on stretcher.

## 2019-04-08 NOTE — ED Notes (Signed)
Patient continues to pull monitor and o2 off, encouraged patient to please leave monitor and 02 on, repositioned in bed

## 2019-04-08 NOTE — ED Notes (Signed)
Patient took Bipap off states he can't breath. Placed on bedpan. Resp at bedside.

## 2019-04-08 NOTE — Progress Notes (Signed)
The patient was admitted early this morning after midnight and H&P has been reviewed and I am in current agreement with assessment and plan done by Dr. Jani Gravel.  Additional changes the plan of care been made accordingly.  The patient is a 73 year old Caucasian [place] veteran with a past medical history significant for but not limited to Crohn's colitis, chronic hypoxic respiratory failure on 2 to 4 L of oxygen via nasal cannula history of significant COPD, OSA, history of prior lung cancer, former tobacco abuse smoker, history of atrial fibrillation unclear if he is on anticoagulation as well as other comorbidities who presents with a chief complaint of "blood pressure being off as well as breathing being off."  States that he is progressively gotten worsening dyspnea over the last week and states that his blood pressures been off.  States that he was unable to breathe so presented to the ED for further evaluation.  He was worked up and admitted for a COPD exacerbation was also found to be in A. fib with RVR and started on a Cardizem drip.  He is being treated for the following:   Acute Respiratory Failure on Chronic Respiratory Failure with Hypoxia, and Hypercapnea Secondary to COPD Exacerbation and ? CHF from A Fib -Admitted to SDU as an inpatient -ABG done showed a pH of 7.305/PCO2 of 78.1/PO2 of 233.0/bicarbonate level of 38.9/O2 saturation of 100% on BiPAP -C/w Solumedrol 80mg  iv q8h -C/w Azithromycin 500mg  iv qday -Stop Spiriva 1puff qday and started the patient on Xopenex/ipratropium nebs every 6 scheduled -Cont Symbicort-> dulera -Albuterol neb q6h stopped due to Xopenex and q6h prn  -Started guaifenesin 1200 mg p.o. twice daily -Continue with flutter valve, incentive spirometry -Given a dose of IV Lasix and will check a BNP -We will check strict I's and O's, daily weights -Continue supplemental oxygen via nasal cannula and wean O2 as tolerated to home dose -CXR on Admission showed "Right  lower lobe opacity, likely reflecting a combination of atelectasis and right pleural effusion, mildly progressive." -Repeat chest x-ray in the a.m.  Afib with RVR (chads2 vasc=2 or 3) -C/w Telemetry Monitoring  -Trop I q3h x2 flat and negative -Check TSH -Check cardiac echo  -Cont aspirin 325 mg po Daily  -Unclear why not on anticoagulation and will be obtaining records from the [place] -Cont Carvedilol 12.5mg  po bid -C/w Cardizem gtt  Volume Overload with ?CHF -We will check a cardiac echocardiogram -Check BNP and give diuresis with Lasix 40 mg times once for now  Crohns Colitis -Cont Mesalamine  GERD -Cont PPI Omeprazole Substitution with Pantoprazole  We will continue monitor patient's clinical response to intervention and repeat blood work and imaging in the a.m.  Currently patient is off of BiPAP now.

## 2019-04-08 NOTE — ED Notes (Signed)
Per Ivin Booty NST, the daughter to bring the patients clothing to bedside.

## 2019-04-08 NOTE — ED Notes (Signed)
Patient sitting up on stretcher Bipap . Denies pain .

## 2019-04-08 NOTE — Progress Notes (Signed)
Patient placed on BIPAP due to MD discretion.

## 2019-04-08 NOTE — H&P (Signed)
TRH H&P    Patient Demographics:    Eddie Rivera, is a 73 y.o. male  MRN: [id number]  DOB - 1945/12/24  Admit Date - 04/07/2019  Referring MD/NP/PA:  Wendee Beavers   Outpatient Primary MD for the patient is Patient, No Pcp Per  Patient coming from:  home  Chief complaint- dyspnea   HPI:    Eddie Rivera  is a 73 y.o. male,   w  Crohns colitis, Chronic hypoxic respiratory failure on 2-4L [place] , Copd, OSA, prior hx of lung cancer, former smoker, recently admitted for acute respiratory failure with hypoxia, and hypercapnea on 04/02/2019 apparently returns due to dyspnea.  Pt states that his cpap or bipap at home doesn't work.   In ED  T 98.8 P 123, R 24, Bp 145/79  Pox 98% on NRB  CXR IMPRESSION: Right lower lobe opacity, likely reflecting a combination of atelectasis and right pleural effusion, mildly progressive.   Wbc 12.9, hgb 13.2, Plt 220 Na 139, K 5.0 Bun 11, Creatinine 0.84  ETOH <10  Trop 7  PH 7.245 Pco2 92.1 Po2 44  covid negative  Pt will be admitted for Copd exacerbation.         Review of systems:    In addition to the HPI above,  No Fever-chills, No Headache, No changes with Vision or hearing, No problems swallowing food or Liquids, No Chest pain, No Cough  No Abdominal pain, No Nausea or Vomiting, bowel movements are regular, No Blood in stool or Urine, No dysuria, No new skin rashes or bruises, No new joints pains-aches,  No new weakness, tingling, numbness in any extremity, No recent weight gain or loss, No polyuria, polydypsia or polyphagia, No significant Mental Stressors.  All other systems reviewed and are negative.    Past History of the following :    Past Medical History:  Diagnosis Date   Atrial fibrillation ([place])    COPD (chronic obstructive pulmonary disease) (HCC)    Crohn's colitis ([place])    Hypertension    Lung cancer ([place])     LLL   OSA (obstructive sleep apnea)       Past Surgical History:  Procedure Laterality Date   HEMORRHOID SURGERY        Social History:      Social History   Tobacco Use   Smoking status: Former Smoker    Types: Cigarettes   Smokeless tobacco: Never Used  Substance Use Topics   Alcohol use: Not Currently       Family History :     Family History  Problem Relation Age of Onset   Dementia Mother    Cervical cancer Mother    Liver cancer Father         Home Medications:   Prior to Admission medications   Medication Sig Start Date End Date Taking? Authorizing Provider  albuterol (PROAIR HFA) 108 (90 Base) MCG/ACT inhaler Inhale 2 puffs into the lungs every 6 (six) hours as needed for wheezing or shortness of breath.    [provider]  albuterol (PROVENTIL) (2.5 MG/3ML) 0.083% nebulizer solution Take 2.5 mg by nebulization See admin instructions. Nebulize 1 vial every 4-6 hours as needed for shortness of breath or wheezing    [provider]  aspirin EC 325 MG tablet Take 325 mg by mouth daily.    [provider]  budesonide-formoterol (SYMBICORT) 160-4.5 MCG/ACT inhaler Inhale 2 puffs into the lungs 2 (two) times daily.    [provider]  carvedilol (COREG) 12.5 MG tablet Take 12.5 mg by mouth 2 (two) times daily with a meal.    [provider]  cetirizine (ZYRTEC) 10 MG tablet Take 10 mg by mouth daily.    [provider]  clotrimazole (LOTRIMIN) 1 % cream Apply 1 application topically See admin instructions. Apply a small amount daily as directed/as needed to legs and feet    [provider]  fluticasone (FLONASE) 50 MCG/ACT nasal spray Place 2 sprays into both nostrils daily.    [provider]  gabapentin (NEURONTIN) 400 MG capsule Take 400 mg by mouth 2 (two) times daily.     [provider]  loperamide (IMODIUM) 2 MG capsule Take 4 mg by mouth 2 (two) times daily as needed  for diarrhea or loose stools.    [provider]  mesalamine (LIALDA) 1.2 g EC tablet Take 2.4 g by mouth daily with breakfast.    [provider]  omeprazole (PRILOSEC) 40 MG capsule Take 40 mg by mouth daily before breakfast.     [provider]  tiotropium (SPIRIVA) 18 MCG inhalation capsule Place 18 mcg into inhaler and inhale daily.    [provider]     Allergies:     Allergies  Allergen Reactions   Atorvastatin Other (See Comments)    Patient doesn't recall the reaction (??)     Physical Exam:   Vitals  Blood pressure 139/81, pulse (!) 120, temperature 98.8 F (37.1 C), temperature source Oral, resp. rate 20, SpO2 100 %.  1.  General: axoxo3  2. Psychiatric: euthymic  3. Neurologic: cn2-12 intact, reflexes 2+ symmetric, diffuse with no clonus, motor 5/5 in all 4 ext  4. HEENMT:  Anicteric, pupils 1.37mm symmetric, direct , consensual, near intact Neck: no jvd,    5. Respiratory : Tight, + bilateral exp wheezing,  Slight decrease in bs right lung base, no crackles  6. Cardiovascular : Irr, irr, s1, s2,   7. Gastrointestinal:  Abd: morbidly obese, nt, nd, +bs  8. Skin:  Ext: no c/c/e,  No rash  9.Musculoskeletal:  Good ROM,  No adenopathy    Data Review:    CBC Recent Labs  Lab 04/02/19 1209  04/03/19 0644 04/04/19 0241 04/05/19 0734 04/08/19 0006 04/08/19 0054 04/08/19 0246  WBC 8.6  --  11.3* 12.6* 9.1 12.9*  --   --   HGB 13.7   < > 14.1 14.8 14.2 13.2 15.0 15.3  HCT 44.5   < > 45.2 50.4 47.8 46.7 44.0 45.0  PLT 219  --  234 220 215 220  --   --   MCV 94.7  --  93.6 99.6 97.4 103.5*  --   --   MCH 29.1  --  29.2 29.2 28.9 29.3  --   --   MCHC 30.8  --  31.2 29.4* 29.7* 28.3*  --   --   RDW 14.3  --  14.9 15.5 15.4 15.0  --   --   LYMPHSABS  --   --   --   --   --  1.5  --   --   MONOABS  --   --   --   --   --  1.7*  --   --   EOSABS  --   --   --   --   --  0.1  --   --   BASOSABS  --   --   --    --   --  0.0  --   --    < > = values in this interval not displayed.   ------------------------------------------------------------------------------------------------------------------  Results for orders placed or performed during the hospital encounter of 04/07/19 (from the past 48 hour(s))  CBC with Differential     Status: Abnormal   Collection Time: 04/08/19 12:06 AM  Result Value Ref Range   WBC 12.9 (H) 4.0 - 10.5 K/uL   RBC 4.51 4.22 - 5.81 MIL/uL   Hemoglobin 13.2 13.0 - 17.0 g/dL   HCT 46.7 39.0 - 52.0 %   MCV 103.5 (H) 80.0 - 100.0 fL   MCH 29.3 26.0 - 34.0 pg   MCHC 28.3 (L) 30.0 - 36.0 g/dL   RDW 15.0 11.5 - 15.5 %   Platelets 220 150 - 400 K/uL   nRBC 0.0 0.0 - 0.2 %   Neutrophils Relative % 73 %   Neutro Abs 9.5 (H) 1.7 - 7.7 K/uL   Lymphocytes Relative 12 %   Lymphs Abs 1.5 0.7 - 4.0 K/uL   Monocytes Relative 13 %   Monocytes Absolute 1.7 (H) 0.1 - 1.0 K/uL   Eosinophils Relative 1 %   Eosinophils Absolute 0.1 0.0 - 0.5 K/uL   Basophils Relative 0 %   Basophils Absolute 0.0 0.0 - 0.1 K/uL   Immature Granulocytes 1 %   Abs Immature Granulocytes 0.07 0.00 - 0.07 K/uL    Comment: Performed at [facility], [address], [place], [place] [address]  Basic metabolic panel     Status: Abnormal   Collection Time: 04/08/19 12:06 AM  Result Value Ref Range   Sodium 139 135 - 145 mmol/L   Potassium 5.0 3.5 - 5.1 mmol/L   Chloride 97 (L) 98 - 111 mmol/L   CO2 35 (H) 22 - 32 mmol/L   Glucose, Bld 139 (H) 70 - 99 mg/dL   BUN 11 8 - 23 mg/dL   Creatinine, Ser 0.84 0.61 - 1.24 mg/dL   Calcium 8.8 (L) 8.9 - 10.3 mg/dL   GFR calc non Af Amer >60 >60 mL/min   GFR calc Af Amer >60 >60 mL/min   Anion gap 7 5 - 15    Comment: Performed at [facility], [place] [address] [place],  [address]  Ethanol     Status: None   Collection Time: 04/08/19 12:06 AM  Result Value Ref Range   Alcohol, Ethyl (B) <10 <10 mg/dL    Comment: (NOTE) Lowest detectable  limit for serum alcohol is 10 mg/dL. For medical purposes only. Performed at [facility], [place] [address], [place], [place] [address]   Troponin I (High Sensitivity)     Status: None   Collection Time: 04/08/19 12:06 AM  Result Value Ref Range   Troponin I (High Sensitivity) 7 <18 ng/L    Comment: (NOTE) Elevated high sensitivity troponin I (hsTnI) values and significant  changes across serial measurements may suggest ACS but many other  chronic and acute conditions are known to elevate hsTnI results.  Refer to the "Links" section for  chest pain algorithms and additional  guidance. Performed at [facility], [place] [address] [place], [place] [address]   I-STAT 7, (LYTES, BLD GAS, ICA, H+H)     Status: Abnormal   Collection Time: 04/08/19 12:54 AM  Result Value Ref Range   pH, Arterial 7.245 (L) 7.350 - 7.450   pCO2 arterial 92.1 (HH) 32.0 - 48.0 mmHg   pO2, Arterial 44.0 (L) 83.0 - 108.0 mmHg   Bicarbonate 39.9 (H) 20.0 - 28.0 mmol/L   TCO2 43 (H) 22 - 32 mmol/L   O2 Saturation 68.0 %   Acid-Base Excess 8.0 (H) 0.0 - 2.0 mmol/L   Sodium 139 135 - 145 mmol/L   Potassium 4.4 3.5 - 5.1 mmol/L   Calcium, Ion 1.27 1.15 - 1.40 mmol/L   HCT 44.0 39.0 - 52.0 %   Hemoglobin 15.0 13.0 - 17.0 g/dL   Patient temperature 98.6 F    Collection site RADIAL, ALLEN'S TEST ACCEPTABLE    Drawn by RT    Sample type ARTERIAL    Comment NOTIFIED PHYSICIAN   SARS Coronavirus 2 (CEPHEID - Performed in [place] hospital lab), Hosp Order     Status: None   Collection Time: 04/08/19  1:23 AM   Specimen: Nasopharyngeal Swab  Result Value Ref Range   SARS Coronavirus 2 NEGATIVE NEGATIVE    Comment: (NOTE) If result is NEGATIVE SARS-CoV-2 target nucleic acids are NOT DETECTED. The SARS-CoV-2 RNA is generally detectable in upper and lower  respiratory specimens during the acute phase of infection. The lowest  concentration of SARS-CoV-2 viral copies this assay can detect is 250  copies  / mL. A negative result does not preclude SARS-CoV-2 infection  and should not be used as the sole basis for treatment or other  patient management decisions.  A negative result may occur with  improper specimen collection / handling, submission of specimen other  than nasopharyngeal swab, presence of viral mutation(s) within the  areas targeted by this assay, and inadequate number of viral copies  (<250 copies / mL). A negative result must be combined with clinical  observations, patient history, and epidemiological information. If result is POSITIVE SARS-CoV-2 target nucleic acids are DETECTED. The SARS-CoV-2 RNA is generally detectable in upper and lower  respiratory specimens dur ing the acute phase of infection.  Positive  results are indicative of active infection with SARS-CoV-2.  Clinical  correlation with patient history and other diagnostic information is  necessary to determine patient infection status.  Positive results do  not rule out bacterial infection or co-infection with other viruses. If result is PRESUMPTIVE POSTIVE SARS-CoV-2 nucleic acids MAY BE PRESENT.   A presumptive positive result was obtained on the submitted specimen  and confirmed on repeat testing.  While 2019 novel coronavirus  (SARS-CoV-2) nucleic acids may be present in the submitted sample  additional confirmatory testing may be necessary for epidemiological  and / or clinical management purposes  to differentiate between  SARS-CoV-2 and other Sarbecovirus currently known to infect humans.  If clinically indicated additional testing with an alternate test  methodology [phone]) is advised. The SARS-CoV-2 RNA is generally  detectable in upper and lower respiratory sp ecimens during the acute  phase of infection. The expected result is Negative. Fact Sheet for Patients:  StrictlyIdeas.no Fact Sheet for Healthcare Providers: BankingDealers.co.za This test  is not yet approved or cleared by the [place] FDA and has been authorized for detection and/or diagnosis of SARS-CoV-2 by FDA under an  Emergency Use Authorization (EUA).  This EUA will remain in effect (meaning this test can be used) for the duration of the COVID-19 declaration under Section 564(b)(1) of the Act, 21 U.S.C. section 360bbb-3(b)(1), unless the authorization is terminated or revoked sooner. Performed at [facility], [place] [address] [place], [place] [address]   I-STAT 7, (LYTES, BLD GAS, ICA, H+H)     Status: Abnormal   Collection Time: 04/08/19  2:46 AM  Result Value Ref Range   pH, Arterial 7.305 (L) 7.350 - 7.450   pCO2 arterial 78.1 (HH) 32.0 - 48.0 mmHg   pO2, Arterial 233.0 (H) 83.0 - 108.0 mmHg   Bicarbonate 38.9 (H) 20.0 - 28.0 mmol/L   TCO2 41 (H) 22 - 32 mmol/L   O2 Saturation 100.0 %   Acid-Base Excess 9.0 (H) 0.0 - 2.0 mmol/L   Sodium 136 135 - 145 mmol/L   Potassium 4.5 3.5 - 5.1 mmol/L   Calcium, Ion 1.23 1.15 - 1.40 mmol/L   HCT 45.0 39.0 - 52.0 %   Hemoglobin 15.3 13.0 - 17.0 g/dL   Patient temperature 98.8 F    Collection site RADIAL, ALLEN'S TEST ACCEPTABLE    Drawn by RT    Sample type ARTERIAL    Comment NOTIFIED PHYSICIAN     Chemistries  Recent Labs  Lab 04/02/19 1209  04/03/19 0644 04/04/19 0241 04/05/19 0734 04/08/19 0006 04/08/19 0054 04/08/19 0246  NA 138   < > 138 140 138 139 139 136  K 4.3   < > 4.5 4.3 5.8* 5.0 4.4 4.5  CL 92*  --  94* 97* 97* 97*  --   --   CO2 36*  --  32 30 32 35*  --   --   GLUCOSE 159*  --  155* 97 119* 139*  --   --   BUN 19  --  22 23 17 11   --   --   CREATININE 1.01  --  1.04 0.95 1.06 0.84  --   --   CALCIUM 8.9  --  8.8* 8.7* 8.6* 8.8*  --   --   MG 1.8  --   --   --   --   --   --   --   AST 23  --   --   --   --   --   --   --   ALT 22  --   --   --   --   --   --   --   ALKPHOS 65  --   --   --   --   --   --   --   BILITOT 0.7  --   --   --   --   --   --   --    < > = values in  this interval not displayed.   ------------------------------------------------------------------------------------------------------------------  ------------------------------------------------------------------------------------------------------------------ GFR: Estimated Creatinine Clearance: 107.2 mL/min (by C-G formula based on SCr of 0.84 mg/dL). Liver Function Tests: Recent Labs  Lab 04/02/19 1209  AST 23  ALT 22  ALKPHOS 65  BILITOT 0.7  PROT 6.2*  ALBUMIN 3.4*   Recent Labs  Lab 04/02/19 1209  LIPASE 18  AMYLASE 26*   Recent Labs  Lab 04/03/19 0826  AMMONIA 28   Coagulation Profile: Recent Labs  Lab 04/02/19 1209  INR 1.2   Cardiac Enzymes: No results for input(s): CKTOTAL, CKMB, CKMBINDEX, TROPONINI in the last 168  hours. BNP (last 3 results) No results for input(s): PROBNP in the last 8760 hours. HbA1C: No results for input(s): HGBA1C in the last 72 hours. CBG: Recent Labs  Lab 04/03/19 0718 04/03/19 1122 04/03/19 1547 04/03/19 1939 04/05/19 1139  GLUCAP 153* 121* 90 119* 119*   Lipid Profile: No results for input(s): CHOL, HDL, LDLCALC, TRIG, CHOLHDL, LDLDIRECT in the last 72 hours. Thyroid Function Tests: No results for input(s): TSH, T4TOTAL, FREET4, T3FREE, THYROIDAB in the last 72 hours. Anemia Panel: No results for input(s): VITAMINB12, FOLATE, FERRITIN, TIBC, IRON, RETICCTPCT in the last 72 hours.  --------------------------------------------------------------------------------------------------------------- Urine analysis: No results found for: COLORURINE, APPEARANCEUR, LABSPEC, PHURINE, GLUCOSEU, HGBUR, BILIRUBINUR, KETONESUR, PROTEINUR, UROBILINOGEN, NITRITE, LEUKOCYTESUR    Imaging Results:    Dg Chest Port 1 View  Result Date: 04/08/2019 CLINICAL DATA:  Shortness of breath EXAM: PORTABLE CHEST 1 VIEW COMPARISON:  04/02/2019 FINDINGS: Right lower lobe opacity, likely reflecting a combination of atelectasis and right pleural  effusion, mildly progressive. No frank interstitial edema.  No pneumothorax. Cardiomegaly. IMPRESSION: Right lower lobe opacity, likely reflecting a combination of atelectasis and right pleural effusion, mildly progressive. Electronically Signed   By: Julian Hy M.D.   On: 04/08/2019 00:35       Assessment & Plan:    Principal Problem:   COPD exacerbation ([place]) Active Problems:   Crohn's colitis ([place])   Atrial fibrillation with RVR (HCC)   OSA (obstructive sleep apnea)   Essential hypertension   Acute respiratory failure on chronic respiratory failure, hypoxic, and hypercapneic Secondary to Copd exacerbation Solumedrol 80mg  iv q8h zithromax 500mg  iv qday Cont Spiriva 1puff qday Cont Symbicort-> dulera Albuterol neb q6h and q6h prn   Afib with RVR (chads2 vasc=2 or 3) Tele Trop I q3h x2 Check TSH Check cardiac echo  Cont aspirin Unclear why not on anticoagulation, please question in am Cont carvedilol 12.5mg  po bid cardizem gtt  Crohns colitis Cont mesalamine  Gerd Cont PPI  DVT Prophylaxis-   Lovenox - SCDs   AM Labs Ordered, also please review Full Orders  Family Communication: Admission, patients condition and plan of care including tests being ordered have been discussed with the patient  who indicate understanding and agree with the plan and Code Status.  Code Status:  FULL CODE, left message on answering machine for Rochele Pages sister that pt will be admitted to [facility]  Admission status: Inpatient: Based on patients clinical presentation and evaluation of above clinical data, I have made determination that patient meets Inpatient criteria at this time. Pt will require iv solumedrol and iv abx, and iv cardizem for Copd exacerbation and afib with RVR, pt will require > 2 nites stay due to high risk of clinical decline   Time spent in minutes : 70   Jani Gravel M.D on 04/08/2019 at 3:57 AM

## 2019-04-08 NOTE — ED Notes (Signed)
Called to check on breakfast tray was informed diet had been changed so new order for tray was put in
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
## 2019-04-09 ENCOUNTER — Other Ambulatory Visit: Payer: Self-pay

## 2019-04-09 DIAGNOSIS — J9621 Acute and chronic respiratory failure with hypoxia: Secondary | ICD-10-CM

## 2019-04-09 DIAGNOSIS — E8770 Fluid overload, unspecified: Secondary | ICD-10-CM

## 2019-04-09 DIAGNOSIS — I4819 Other persistent atrial fibrillation: Secondary | ICD-10-CM

## 2019-04-09 DIAGNOSIS — J9622 Acute and chronic respiratory failure with hypercapnia: Secondary | ICD-10-CM

## 2019-04-09 LAB — CBC WITH DIFFERENTIAL/PLATELET
Abs Immature Granulocytes: 0.08 10*3/uL — ABNORMAL HIGH (ref 0.00–0.07)
Basophils Absolute: 0 10*3/uL (ref 0.0–0.1)
Basophils Relative: 0 %
Eosinophils Absolute: 0 10*3/uL (ref 0.0–0.5)
Eosinophils Relative: 0 %
HCT: 42.4 % (ref 39.0–52.0)
Hemoglobin: 12.5 g/dL — ABNORMAL LOW (ref 13.0–17.0)
Immature Granulocytes: 1 %
Lymphocytes Relative: 7 %
Lymphs Abs: 0.9 10*3/uL (ref 0.7–4.0)
MCH: 29.2 pg (ref 26.0–34.0)
MCHC: 29.5 g/dL — ABNORMAL LOW (ref 30.0–36.0)
MCV: 99.1 fL (ref 80.0–100.0)
Monocytes Absolute: 0.5 10*3/uL (ref 0.1–1.0)
Monocytes Relative: 4 %
Neutro Abs: 11.6 10*3/uL — ABNORMAL HIGH (ref 1.7–7.7)
Neutrophils Relative %: 88 %
Platelets: 248 10*3/uL (ref 150–400)
RBC: 4.28 MIL/uL (ref 4.22–5.81)
RDW: 14.6 % (ref 11.5–15.5)
WBC: 13 10*3/uL — ABNORMAL HIGH (ref 4.0–10.5)
nRBC: 0 % (ref 0.0–0.2)

## 2019-04-09 LAB — GLUCOSE, CAPILLARY
Glucose-Capillary: 175 mg/dL — ABNORMAL HIGH (ref 70–99)
Glucose-Capillary: 227 mg/dL — ABNORMAL HIGH (ref 70–99)
Glucose-Capillary: 192 mg/dL — ABNORMAL HIGH (ref 70–99)
Glucose-Capillary: 181 mg/dL — ABNORMAL HIGH (ref 70–99)
Glucose-Capillary: 173 mg/dL — ABNORMAL HIGH (ref 70–99)

## 2019-04-09 LAB — COMPREHENSIVE METABOLIC PANEL
ALT: 26 U/L (ref 0–44)
AST: 17 U/L (ref 15–41)
Albumin: 3.1 g/dL — ABNORMAL LOW (ref 3.5–5.0)
Alkaline Phosphatase: 68 U/L (ref 38–126)
Anion gap: 10 (ref 5–15)
BUN: 18 mg/dL (ref 8–23)
CO2: 38 mmol/L — ABNORMAL HIGH (ref 22–32)
Calcium: 9 mg/dL (ref 8.9–10.3)
Chloride: 91 mmol/L — ABNORMAL LOW (ref 98–111)
Creatinine, Ser: 0.9 mg/dL (ref 0.61–1.24)
GFR calc Af Amer: >60 mL/min (ref >60)
GFR calc non Af Amer: >60 mL/min (ref >60)
Glucose, Bld: 185 mg/dL — ABNORMAL HIGH (ref 70–99)
Potassium: 5 mmol/L (ref 3.5–5.1)
Sodium: 139 mmol/L (ref 135–145)
Total Bilirubin: 0.7 mg/dL (ref 0.3–1.2)
Total Protein: 6.3 g/dL — ABNORMAL LOW (ref 6.5–8.1)

## 2019-04-09 LAB — PHOSPHORUS: Phosphorus: 2.6 mg/dL (ref 2.5–4.6)

## 2019-04-09 LAB — MAGNESIUM: Magnesium: 2 mg/dL (ref 1.7–2.4)

## 2019-04-09 LAB — HEMOGLOBIN A1C
Hgb A1c MFr Bld: 7.4 % — ABNORMAL HIGH (ref 4.8–5.6)
Mean Plasma Glucose: 165.68 mg/dL

## 2019-04-09 MED ORDER — ALUM & MAG HYDROXIDE-SIMETH 200-200-20 MG/5ML PO SUSP
30.0000 mL | Freq: Four times a day (QID) | ORAL | Status: DC | PRN
  Administered 2019-04-09: 30 mL via ORAL
  Filled 2019-04-09: qty 30

## 2019-04-09 MED ORDER — LORAZEPAM 2 MG/ML IJ SOLN: 2.0000 mg | INTRAMUSCULAR | Status: DC | PRN

## 2019-04-09 MED ORDER — IPRATROPIUM BROMIDE 0.02 % IN SOLN
0.5000 mg | Freq: Three times a day (TID) | RESPIRATORY_TRACT | Status: DC
  Administered 2019-04-09 – 2019-04-12 (×9): 0.5 mg via RESPIRATORY_TRACT
  Filled 2019-04-09 (×10): qty 2.5

## 2019-04-09 MED ORDER — METHYLPREDNISOLONE SODIUM SUCC 125 MG IJ SOLR
60.0000 mg | Freq: Three times a day (TID) | INTRAMUSCULAR | Status: DC
  Administered 2019-04-09 – 2019-04-12 (×8): 60 mg via INTRAVENOUS
  Filled 2019-04-09 (×8): qty 2

## 2019-04-09 MED ORDER — INSULIN ASPART 100 UNIT/ML ~~LOC~~ SOLN
0.0000 [IU] | Freq: Three times a day (TID) | SUBCUTANEOUS | Status: DC
  Administered 2019-04-10 – 2019-04-11 (×4): 3 [IU] via SUBCUTANEOUS
  Administered 2019-04-11: 09:00:00 5 [IU] via SUBCUTANEOUS
  Administered 2019-04-11: 12:00:00 3 [IU] via SUBCUTANEOUS

## 2019-04-09 MED ORDER — VITAMIN B-1 100 MG PO TABS
100.0000 mg | ORAL_TABLET | Freq: Every day | ORAL | Status: DC
  Administered 2019-04-09 – 2019-04-12 (×4): 100 mg via ORAL
  Filled 2019-04-09 (×4): qty 1

## 2019-04-09 MED ORDER — INSULIN ASPART 100 UNIT/ML ~~LOC~~ SOLN: 0.0000 [IU] | Freq: Every day | SUBCUTANEOUS | Status: DC

## 2019-04-09 MED ORDER — APIXABAN 5 MG PO TABS
5.0000 mg | ORAL_TABLET | Freq: Two times a day (BID) | ORAL | Status: DC
  Administered 2019-04-09 – 2019-04-10 (×2): 5 mg via ORAL
  Filled 2019-04-09 (×2): qty 1

## 2019-04-09 MED ORDER — FOLIC ACID 1 MG PO TABS
1.0000 mg | ORAL_TABLET | Freq: Every day | ORAL | Status: DC
  Administered 2019-04-09 – 2019-04-12 (×4): 1 mg via ORAL
  Filled 2019-04-09 (×4): qty 1

## 2019-04-09 MED ORDER — LEVALBUTEROL HCL 0.63 MG/3ML IN NEBU
0.6300 mg | INHALATION_SOLUTION | Freq: Three times a day (TID) | RESPIRATORY_TRACT | Status: DC
  Administered 2019-04-09 – 2019-04-12 (×9): 0.63 mg via RESPIRATORY_TRACT
  Filled 2019-04-09 (×10): qty 3

## 2019-04-09 MED ORDER — DILTIAZEM HCL ER COATED BEADS 120 MG PO CP24
120.0000 mg | ORAL_CAPSULE | Freq: Every day | ORAL | Status: DC
  Administered 2019-04-09 – 2019-04-12 (×4): 120 mg via ORAL
  Filled 2019-04-09 (×4): qty 1

## 2019-04-09 MED ORDER — ADULT MULTIVITAMIN W/MINERALS CH
1.0000 | ORAL_TABLET | Freq: Every day | ORAL | Status: DC
  Administered 2019-04-09 – 2019-04-12 (×4): 1 via ORAL
  Filled 2019-04-09 (×4): qty 1

## 2019-04-09 MED ORDER — ENOXAPARIN SODIUM 60 MG/0.6ML ~~LOC~~ SOLN: 60.0000 mg | Freq: Every day | SUBCUTANEOUS | Status: DC

## 2019-04-09 MED ORDER — FUROSEMIDE 10 MG/ML IJ SOLN
40.0000 mg | Freq: Once | INTRAMUSCULAR | Status: AC
  Administered 2019-04-09: 40 mg via INTRAVENOUS
  Filled 2019-04-09: qty 4

## 2019-04-09 MED ORDER — INSULIN ASPART 100 UNIT/ML ~~LOC~~ SOLN: 0.0000 [IU] | Freq: Three times a day (TID) | SUBCUTANEOUS | Status: DC

## 2019-04-09 MED ORDER — LORAZEPAM 0.5 MG PO TABS
0.5000 mg | ORAL_TABLET | Freq: Once | ORAL | Status: AC
  Administered 2019-04-09: 0.5 mg via ORAL
  Filled 2019-04-09: qty 1

## 2019-04-09 NOTE — Progress Notes (Signed)
Inpatient Diabetes Program Recommendations  AACE/ADA: New Consensus Statement on Inpatient Glycemic Control (2015)  Target Ranges:  Prepandial:   less than 140 mg/dL      Peak postprandial:   less than 180 mg/dL (1-2 hours)      Critically ill patients:  140 - 180 mg/dL   Results for Eddie Rivera, Eddie Rivera (MRN [id number]) as of 04/09/2019 09:31  Ref. Range 04/08/2019 15:59 04/08/2019 22:31 04/09/2019 02:44 04/09/2019 07:37  Glucose-Capillary Latest Ref Range: 70 - 99 mg/dL 254 (H) 182 (H) 175 (H) 227 (H)    Review of Glycemic Control  Diabetes history: none  Current orders for Inpatient glycemic control: None  Receiving IV Solumedrol 80 mg Q8 hours.  Inpatient Diabetes Program Recommendations:    Glucose trends elevated. Consider CBGs and if elevated Novolog 0-9 units tid + Novolog 0-5 units qhs.  Thanks,  Tama Headings RN, MSN, BC-ADM Inpatient Diabetes Coordinator Team Pager [phone] (8a-5p)
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
## 2019-04-09 NOTE — Progress Notes (Signed)
Spoke with pt's daughter. She said that pt's cpap was not working properly at home and that his oxygen drops to the 50's when he has it on at night. She said he was supposed to have appt at [facility] today to have the cpap looked at. She also stated that he had not drank alcohol in years. Cont to monitor. Carroll Kinds RN

## 2019-04-09 NOTE — Progress Notes (Signed)
PROGRESS NOTE    Eddie Rivera  ONG:[id number] DOB: 1946/06/13 DOA: 04/07/2019 PCP: Patient, No Pcp Per   Brief Narrative:  The patient is a 73 year old Caucasian [place] veteran with a past medical history significant for but not limited to Crohn's colitis, chronic hypoxic respiratory failure on 2 to 4 L of oxygen via nasal cannula history of significant COPD, OSA, history of prior lung cancer, former tobacco abuse smoker, history of atrial fibrillation unclear if he is on anticoagulation as well as other comorbidities who presents with a chief complaint of "blood pressure being off as well as breathing being off."  States that he is progressively gotten worsening dyspnea over the last week and states that his blood pressures been off.  States that he was unable to breathe so presented to the ED for further evaluation.  He was worked up and admitted for a COPD exacerbation was also found to be in A. fib with RVR and started on a Cardizem drip.  Patient's respiratory status is slowly improving and he continues remain volume overloaded so we will have cardiology evaluate.  Because he had been frequently hospitalized will obtain a palliative care consultation for goals of care discussion.  Assessment & Plan:   Principal Problem:   COPD exacerbation ([facility]) Active Problems:   Crohn's colitis ([facility])   Atrial fibrillation with RVR ([facility])   OSA (obstructive sleep apnea)   Essential hypertension   Acute Respiratory Failure on Chronic Respiratory Failure with Hypoxia, and Hypercapnea Secondary to COPD Exacerbation and ? CHF from A Fib along with OSA  -Admitted to SDU as an inpatient -ABG done showed a pH of 7.305/PCO2 of 78.1/PO2 of 233.0/bicarbonate level of 38.9/O2 saturation of 100% on BiPAP -Weaned off of BiPAP and now back on 4 L nasal cannula -Continuous pulse oximetry and maintain O2 saturations greater than 90% -Continue supplemental oxygen via nasal cannula and wean to home dose  -Placed on Solumedrol 80mg  iv q8h and have reduced dose to 60 mg IV q. 8 scheduled -C/w Azithromycin 500mg  iv qday for least 5 days -Stop Spiriva 1puff qday and started the patient on Xopenex/ipratropium nebs every 6 scheduled -Cont Symbicort-> Dulera substitution -Albuterol neb q6h stopped due to Xopenex and q6h prn  -Started guaifenesin 1200 mg p.o. twice daily -Continue with flutter valve, incentive spirometry -Given a dose of IV Lasix and will check a BNP; still appears volume overloaded and will give another 40 mg IV Lasix  -We will check strict I's and O's, daily weights; Patient is -1.095 Liters since admission -BNP was 110.5 -Continue supplemental oxygen via nasal cannula and wean O2 as tolerated to home dose -CXR on Admission showed "Right lower lobe opacity, likely reflecting a combination of atelectasis and right pleural effusion, mildly progressive." -Repeat chest x-ray in the a.m. -We will place the patient on CPAP at night -Will discuss with Cardiology  -We will also obtain a Palliative Consultation given patient's multiple admissions the last few weeks  Afib with RVR (chads2 vasc=2 or 3) -C/w Telemetry Monitoring  -Trop I q3h x2 flat and negative -Check TSH -Check Cardiac ECHOCardiogram and showed left ventricule systolic function with an EF of 60 to 65% left ventricular diastolic function could not be evaluated due to A. fib.  There is no increase in right ventricle wall thickness and right ventricular systolic pressure was mildly elevated.  There is musculatures of the aortic valve and aortic valve regurgitation was not assessed by color Doppler flow inferior vena cava was dilated in  size with greater than 50% respiratory variability -Cont aspirin 325 mg po Daily  -Unclear why not on anticoagulation and will be obtaining records from the [facility] -Cont Carvedilol 12.5mg  po bid -C/w Cardizem gtt and transition to po 120 mg Cardizem CD -Will need Cardiology Evaluation and  further recommendations about Anticoagulation and have consulted Dr. Virgina Jock  Volume Overload with ?CHF -We will check a Cardiac Echocardiogram and systolic function was normal but diastolic function could not be assessed due to A. fib -Checked BNP and was 110. -Strict I's and O's and daily weights; fluid restrict to 1500 mL's -Given Diuresis with Lasix 40 milligrams again today -We will have cardiology weigh in for formal evaluation  Crohns Colitis -Cont Mesalamine 0.4 g daily with breakfast  GERD -Cont PPI Omeprazole Substitution with Pantoprazole  Hyperglycemia in the setting of new onset Diabetes Mellitus Type 2 -Hemoglobin A1c was 7.4 -Blood sugars are likely worsened in the setting of IV steroid demargination -Sugars have been ranging from 181-227 -Placed on a sensitive NovoLog sliding scale and have now increases to moderate NovoLog sliding scale before meals and at bedtime  Obesity -Estimated body mass index is 36.12 kg/m as calculated from the following:   Height as of 04/05/19: 6' (1.829 m).   Weight as of this encounter: 120.8 kg. -Weight Loss and Dietary Counseling Given  Metabolic alkalosis -Likely in the setting of diuresis and COPD -We will need to continue to monitor carefully -Continue monitor and trend and repeat CMP in a.m.  Leukocytosis -Patient's WBC went from 12.7 and down 13.0 -Setting of IV steroid demargination and reactive from acute exacerbation of COPD next-continue monitor for signs and symptoms of infection Continue with IV azithromycin -Repeat CBC in a.m.  Macrocytic/Normocytic Anemia -Patient's hemoglobin/hematocrit went from 13.5/47.8 and is now 12.5/42.4 -Check anemia panel in the a.m. -We will monitor for signs and symptoms of bleeding; currently no overt bleeding noted -Repeat CBC in AM   History of Alcohol Abuse -Placed on CIWA protocol   DVT prophylaxis: Enoxaparin 60 mg sq Daily  Code Status: FULL CODE Family  Communication: No family present at bedside  Disposition Plan: Pending further work-up and improvement in clinical status as well as evaluation by palliative care medicine cardiology   Consultants:   [facility]   Cardiology Dr. Virgina Jock    Procedures:  ECHOCARDIOGRAM IMPRESSIONS    1. The left ventricle has normal systolic function with an ejection fraction of 60-65%. The cavity size was mild to moderately dilated. There is mildly increased left ventricular wall thickness. Left ventricular diastolic function could not be evaluated  secondary to atrial fibrillation. Elevated left ventricular end-diastolic pressure.  2. The right ventricle was not well visualized. The cavity was normal. There is no increase in right ventricular wall thickness. Right ventricular systolic pressure is mildly elevated.  3. Left atrial size was mildly dilated.  4. The aortic valve is tricuspid. Mild sclerosis of the aortic valve. Aortic valve regurgitation was not assessed by color flow Doppler.  5. The aorta is normal in size and structure.  6. The inferior vena cava was dilated in size with >50% respiratory variability.  FINDINGS  Left Ventricle: The left ventricle has normal systolic function, with an ejection fraction of 60-65%. The cavity size was mild to moderately dilated. There is mildly increased left ventricular wall thickness. Left ventricular diastolic function could  not be evaluated secondary to atrial fibrillation. Elevated left ventricular end-diastolic pressure  Right Ventricle: The right ventricle was not well  visualized. The cavity was normal. There is no increase in right ventricular wall thickness. Right ventricular systolic pressure is mildly elevated.  Left Atrium: Left atrial size was mildly dilated.  Right Atrium: Right atrial size was normal in size.  Interatrial Septum: No atrial level shunt detected by color flow Doppler.  Pericardium: There is no  evidence of pericardial effusion.  Mitral Valve: The mitral valve is normal in structure. Mitral valve regurgitation was not assessed by color flow Doppler.  Tricuspid Valve: The tricuspid valve is normal in structure. Tricuspid valve regurgitation is mild by color flow Doppler.  Aortic Valve: The aortic valve is tricuspid Mild sclerosis of the aortic valve. Aortic valve regurgitation was not assessed by color flow Doppler.  Pulmonic Valve: The pulmonic valve was normal in structure. Pulmonic valve regurgitation is not visualized by color flow Doppler.  Aorta: The aorta is normal in size and structure.  Venous: The inferior vena cava measures 2.43 cm, is dilated in size with greater than 50% respiratory variability.    +--------------+--------++ LEFT VENTRICLE         +----------------+----------++ +--------------+--------++ Diastology                 PLAX 2D                +----------------+----------++ +--------------+--------++ LV e' lateral:  12.40 cm/s LVIDd:        6.10 cm  +----------------+----------++ +--------------+--------++ LV E/e' lateral:9.0        LVIDs:        5.20 cm  +----------------+----------++ +--------------+--------++ LV e' medial:   6.09 cm/s  LV PW:        1.20 cm  +----------------+----------++ +--------------+--------++ LV E/e' medial: 18.2       LV IVS:       1.10 cm  +----------------+----------++ +--------------+--------++ LVOT diam:    2.20 cm  +--------------+--------++ LV SV:        57 ml    +--------------+--------++ LV SV Index:  23.02    +--------------+--------++ LVOT Area:    3.80 cm +--------------+--------++                        +--------------+--------++  +---------------+---------++ RIGHT VENTRICLE          +---------------+---------++ RV Basal diam: 3.20 cm   +---------------+---------++ RV S prime:    7.40 cm/s +---------------+---------++  TAPSE (M-mode):2.9 cm    +---------------+---------++ RVSP:          37.3 mmHg +---------------+---------++  +---------------+-------++-----------++ LEFT ATRIUM           Index       +---------------+-------++-----------++ LA diam:       4.20 cm1.76 cm/m  +---------------+-------++-----------++ LA Vol (A2C):  65.8 ml27.57 ml/m +---------------+-------++-----------++ LA Vol (A4C):  64.1 ml26.86 ml/m +---------------+-------++-----------++ LA Biplane Vol:65.1 ml27.28 ml/m +---------------+-------++-----------++ +------------+---------++-----------++ RIGHT ATRIUM         Index       +------------+---------++-----------++ RA Pressure:8.00 mmHg            +------------+---------++-----------++ RA Area:    15.60 cm            +------------+---------++-----------++ RA Volume:  35.80 ml 15.00 ml/m +------------+---------++-----------++  +------------+-----------++ AORTIC VALVE            +------------+-----------++ LVOT Vmax:  99.00 cm/s  +------------+-----------++ LVOT Vmean: 59.600 cm/s +------------+-----------++ LVOT VTI:   0.159 m     +------------+-----------++   +-------------+-------++ AORTA                +-------------+-------++  Ao Root diam:3.60 cm +-------------+-------++  +--------------+----------++  +---------------+-----------++ MITRAL VALVE              TRICUSPID VALVE            +--------------+----------++  +---------------+-----------++ MV Area (PHT):6.63 cm    TR Peak grad:  29.3 mmHg   +--------------+----------++  +---------------+-----------++ MV PHT:       33.20 msec  TR Vmax:       276.00 cm/s +--------------+----------++  +---------------+-----------++ MV Decel Time:115 msec    Estimated RAP: 8.00 mmHg   +--------------+----------++  +---------------+-----------++ +--------------+-----------++ RVSP:          37.3 mmHg    MV E velocity:111.00 cm/s +---------------+-----------++ +--------------+-----------++                               +--------------+-------+                               SHUNTS                                              +--------------+-------+                               Systemic VTI: 0.16 m                                +--------------+-------+                               Systemic Diam:2.20 cm                               +--------------+-------+  +---------+-------+ IVC              +---------+-------+ IVC diam:2.43 cm +---------+-------+   Antimicrobials:  Anti-infectives (From admission, onward)   Start     Dose/Rate Route Frequency Ordered Stop   04/08/19 0400  azithromycin (ZITHROMAX) 500 mg in sodium chloride 0.9 % 250 mL IVPB     500 mg 250 mL/hr over 60 Minutes Intravenous Every 24 hours 04/08/19 0332       Subjective: Seen and examined and states that his shortness of breath was a little bit better but states that he is very confused yesterday.  More alert today and stated that he was was to get a new CPAP.  States he is having a difficult time coughing up some phlegm.  No nausea or vomiting.  No other concerns or complaints at this time.  Objective: Vitals:   04/09/19 0903 04/09/19 1403 04/09/19 1407 04/09/19 1618  BP:    114/80  Pulse:    84  Resp:    16  Temp:    98 F (36.7 C)  TempSrc:    Oral  SpO2: 93% 93% 93% 94%  Weight:        Intake/Output Summary (Last 24 hours) at 04/09/2019 1638 Last data filed at 04/09/2019 0700 Gross per 24 hour  Intake 700 ml  Output 930 ml  Net -230 ml   [employer]  04/09/19 0314  Weight: 120.8 kg   Examination: Physical Exam:  Constitutional: WN/WD obese Caucasian male in NAD and appears calm but slightly uncomfortable Eyes: Lids and conjunctivae normal, sclerae anicteric  ENMT: External Ears, Nose appear normal. Grossly normal hearing. Mucous membranes are moist.  Neck:  Appears normal, supple, no cervical masses, normal ROM, no appreciable thyromegaly; no JVD Respiratory: Diminished to auscultation bilaterally, no wheezing, rales, rhonchi or crackles but has coarse breath sounds. Normal respiratory effort and patient is not tachypenic. No accessory muscle use. Wearing 4 Liters of Supplemental O2 via [place] Cardiovascular: Irregularly Irregular and Tachycardic, no murmurs / rubs / gallops. S1 and S2 auscultated.  Abdomen: Soft, non-tender,Distended 2/2 body habitus. No masses palpated. No appreciable hepatosplenomegaly. Bowel sounds positive x4.  GU: Deferred. Musculoskeletal: No clubbing / cyanosis of digits/nails. No joint deformity upper and lower extremities.  Skin: No rashes, lesions, ulcers on a limited skin evaluation. No induration; Warm and dry.  Neurologic: CN 2-12 grossly intact with no focal deficits.  Romberg sign and cerebellar reflexes not assessed.  Psychiatric: Normal judgment and insight. Alert and oriented x 3. Anxious mood and appropriate affect.   Data Reviewed: I have personally reviewed following labs and imaging studies  CBC: Recent Labs  Lab 04/04/19 0241 04/05/19 0734 04/08/19 0006 04/08/19 0054 04/08/19 0246 04/08/19 0410 04/09/19 0350  WBC 12.6* 9.1 12.9*  --   --  12.7* 13.0*  NEUTROABS  --   --  9.5*  --   --   --  11.6*  HGB 14.8 14.2 13.2 15.0 15.3 13.5 12.5*  HCT 50.4 47.8 46.7 44.0 45.0 47.8 42.4  MCV 99.6 97.4 103.5*  --   --  103.5* 99.1  PLT 220 215 220  --   --  235 562   Basic Metabolic Panel: Recent Labs  Lab 04/04/19 0241 04/05/19 0734 04/08/19 0006 04/08/19 0054 04/08/19 0246 04/08/19 0410 04/09/19 0350  NA 140 138 139 139 136 140 139  K 4.3 5.8* 5.0 4.4 4.5 5.1 5.0  CL 97* 97* 97*  --   --  97* 91*  CO2 30 32 35*  --   --  35* 38*  GLUCOSE 97 119* 139*  --   --  137* 185*  BUN 23 17 11   --   --  8 18  CREATININE 0.95 1.06 0.84  --   --  0.93 0.90  CALCIUM 8.7* 8.6* 8.8*  --   --  8.8* 9.0  MG  --    --   --   --   --   --  2.0  PHOS  --   --   --   --   --   --  2.6   GFR: Estimated Creatinine Clearance: 101.1 mL/min (by C-G formula based on SCr of 0.9 mg/dL). Liver Function Tests: Recent Labs  Lab 04/08/19 0410 04/09/19 0350  AST 24 17  ALT 30 26  ALKPHOS 79 68  BILITOT 1.0 0.7  PROT 7.0 6.3*  ALBUMIN 3.4* 3.1*   No results for input(s): LIPASE, AMYLASE in the last 168 hours. Recent Labs  Lab 04/03/19 0826  AMMONIA 28   Coagulation Profile: No results for input(s): INR, PROTIME in the last 168 hours. Cardiac Enzymes: No results for input(s): CKTOTAL, CKMB, CKMBINDEX, TROPONINI in the last 168 hours. BNP (last 3 results) No results for input(s): PROBNP in the last 8760 hours. HbA1C: No results for input(s): HGBA1C in the last 72 hours. CBG: Recent Labs  Lab 04/08/19 2231 04/09/19 0244 04/09/19 0737 04/09/19 1118 04/09/19 1621  GLUCAP 182* 175* 227* 192* 181*   Lipid Profile: No results for input(s): CHOL, HDL, LDLCALC, TRIG, CHOLHDL, LDLDIRECT in the last 72 hours. Thyroid Function Tests: No results for input(s): TSH, T4TOTAL, FREET4, T3FREE, THYROIDAB in the last 72 hours. Anemia Panel: No results for input(s): VITAMINB12, FOLATE, FERRITIN, TIBC, IRON, RETICCTPCT in the last 72 hours. Sepsis Labs: No results for input(s): PROCALCITON, LATICACIDVEN in the last 168 hours.  Recent Results (from the past 240 hour(s))  MRSA PCR Screening     Status: Abnormal   Collection Time: 04/02/19 10:57 AM   Specimen: Nasal Mucosa; Nasopharyngeal  Result Value Ref Range Status   MRSA by PCR POSITIVE (A) NEGATIVE Final    Comment:        The GeneXpert MRSA Assay (FDA approved for NASAL specimens only), is one component of a comprehensive MRSA colonization surveillance program. It is not intended to diagnose MRSA infection nor to guide or monitor treatment for MRSA infections. RESULT CALLED TO, READ BACK BY AND VERIFIED WITH: Cleon Dew RN 12:35 04/02/19 ([email or web address])  Performed at [facility], [place] [address] [place], [place] [address]   SARS Coronavirus 2 [facility] order, Performed in [place] [facility] hospital lab)     Status: None   Collection Time: 04/02/19 10:59 AM  Result Value Ref Range Status   SARS Coronavirus 2 NEGATIVE NEGATIVE Final    Comment: (NOTE) If result is NEGATIVE SARS-CoV-2 target nucleic acids are NOT DETECTED. The SARS-CoV-2 RNA is generally detectable in upper and lower  respiratory specimens during the acute phase of infection. The lowest  concentration of SARS-CoV-2 viral copies this assay can detect is 250  copies / mL. A negative result does not preclude SARS-CoV-2 infection  and should not be used as the sole basis for treatment or other  patient management decisions.  A negative result may occur with  improper specimen collection / handling, submission of specimen other  than nasopharyngeal swab, presence of viral mutation(s) within the  areas targeted by this assay, and inadequate number of viral copies  (<250 copies / mL). A negative result must be combined with clinical  observations, patient history, and epidemiological information. If result is POSITIVE SARS-CoV-2 target nucleic acids are DETECTED. The SARS-CoV-2 RNA is generally detectable in upper and lower  respiratory specimens dur ing the acute phase of infection.  Positive  results are indicative of active infection with SARS-CoV-2.  Clinical  correlation with patient history and other diagnostic information is  necessary to determine patient infection status.  Positive results do  not rule out bacterial infection or co-infection with other viruses. If result is PRESUMPTIVE POSTIVE SARS-CoV-2 nucleic acids MAY BE PRESENT.   A presumptive positive result was obtained on the submitted specimen  and confirmed on repeat testing.  While 2019 novel coronavirus  (SARS-CoV-2) nucleic acids may be present in the submitted sample  additional confirmatory testing  may be necessary for epidemiological  and / or clinical management purposes  to differentiate between  SARS-CoV-2 and other Sarbecovirus currently known to infect humans.  If clinically indicated additional testing with an alternate test  methodology [phone]) is advised. The SARS-CoV-2 RNA is generally  detectable in upper and lower respiratory sp ecimens during the acute  phase of infection. The expected result is Negative. Fact Sheet for Patients:  StrictlyIdeas.no Fact Sheet for Healthcare Providers: BankingDealers.co.za This test is not yet approved or cleared by the [place]  States FDA and has been authorized for detection and/or diagnosis of SARS-CoV-2 by FDA under an Emergency Use Authorization (EUA).  This EUA will remain in effect (meaning this test can be used) for the duration of the COVID-19 declaration under Section 564(b)(1) of the Act, 21 U.S.C. section 360bbb-3(b)(1), unless the authorization is terminated or revoked sooner. Performed at [facility], [place] [address] [place], [place] [address]   Culture, blood (routine x 2)     Status: None   Collection Time: 04/02/19 12:00 PM   Specimen: BLOOD RIGHT HAND  Result Value Ref Range Status   Specimen Description BLOOD RIGHT HAND  Final   Special Requests   Final    BOTTLES DRAWN AEROBIC ONLY Blood Culture adequate volume   Culture   Final    NO GROWTH 5 DAYS Performed at [facility], [place] [address], [place], [place] [address]    Report Status 04/07/2019 FINAL  Final  Culture, blood (routine x 2)     Status: None   Collection Time: 04/02/19 12:20 PM   Specimen: BLOOD  Result Value Ref Range Status   Specimen Description BLOOD RIGHT ANTECUBITAL  Final   Special Requests   Final    BOTTLES DRAWN AEROBIC ONLY Blood Culture adequate volume   Culture   Final    NO GROWTH 5 DAYS Performed at [facility], [place] [address], [place], [place] [address]     Report Status 04/07/2019 FINAL  Final  Urine culture     Status: None   Collection Time: 04/02/19  5:00 PM   Specimen: Urine, Catheterized  Result Value Ref Range Status   Specimen Description URINE, CATHETERIZED  Final   Special Requests NONE  Final   Culture   Final    NO GROWTH Performed at [facility], [place] [address] [place], [place] [address]    Report Status 04/03/2019 FINAL  Final  SARS Coronavirus 2 (CEPHEID - Performed in [place] hospital lab), Hosp Order     Status: None   Collection Time: 04/08/19  1:23 AM   Specimen: Nasopharyngeal Swab  Result Value Ref Range Status   SARS Coronavirus 2 NEGATIVE NEGATIVE Final    Comment: (NOTE) If result is NEGATIVE SARS-CoV-2 target nucleic acids are NOT DETECTED. The SARS-CoV-2 RNA is generally detectable in upper and lower  respiratory specimens during the acute phase of infection. The lowest  concentration of SARS-CoV-2 viral copies this assay can detect is 250  copies / mL. A negative result does not preclude SARS-CoV-2 infection  and should not be used as the sole basis for treatment or other  patient management decisions.  A negative result may occur with  improper specimen collection / handling, submission of specimen other  than nasopharyngeal swab, presence of viral mutation(s) within the  areas targeted by this assay, and inadequate number of viral copies  (<250 copies / mL). A negative result must be combined with clinical  observations, patient history, and epidemiological information. If result is POSITIVE SARS-CoV-2 target nucleic acids are DETECTED. The SARS-CoV-2 RNA is generally detectable in upper and lower  respiratory specimens dur ing the acute phase of infection.  Positive  results are indicative of active infection with SARS-CoV-2.  Clinical  correlation with patient history and other diagnostic information is  necessary to determine patient infection status.  Positive results do  not rule out  bacterial infection or co-infection with other viruses. If result is PRESUMPTIVE POSTIVE SARS-CoV-2  nucleic acids MAY BE PRESENT.   A presumptive positive result was obtained on the submitted specimen  and confirmed on repeat testing.  While 2019 novel coronavirus  (SARS-CoV-2) nucleic acids may be present in the submitted sample  additional confirmatory testing may be necessary for epidemiological  and / or clinical management purposes  to differentiate between  SARS-CoV-2 and other Sarbecovirus currently known to infect humans.  If clinically indicated additional testing with an alternate test  methodology [phone]) is advised. The SARS-CoV-2 RNA is generally  detectable in upper and lower respiratory sp ecimens during the acute  phase of infection. The expected result is Negative. Fact Sheet for Patients:  StrictlyIdeas.no Fact Sheet for Healthcare Providers: BankingDealers.co.za This test is not yet approved or cleared by the [place] FDA and has been authorized for detection and/or diagnosis of SARS-CoV-2 by FDA under an Emergency Use Authorization (EUA).  This EUA will remain in effect (meaning this test can be used) for the duration of the COVID-19 declaration under Section 564(b)(1) of the Act, 21 U.S.C. section 360bbb-3(b)(1), unless the authorization is terminated or revoked sooner. Performed at [facility], [place] [address], [place], [place] [address]     Radiology Studies: Dg Chest Port 1 View  Result Date: 04/08/2019 CLINICAL DATA:  Shortness of breath EXAM: PORTABLE CHEST 1 VIEW COMPARISON:  04/02/2019 FINDINGS: Right lower lobe opacity, likely reflecting a combination of atelectasis and right pleural effusion, mildly progressive. No frank interstitial edema.  No pneumothorax. Cardiomegaly. IMPRESSION: Right lower lobe opacity, likely reflecting a combination of atelectasis and right pleural effusion, mildly  progressive. Electronically Signed   By: Julian Hy M.D.   On: 04/08/2019 00:35   Scheduled Meds: . aspirin EC  325 mg Oral Daily  . carvedilol  12.5 mg Oral BID WC  . diltiazem  120 mg Oral Daily  . [START ON 04/10/2019] enoxaparin (LOVENOX) injection  60 mg Subcutaneous Daily  . fluticasone  2 spray Each Nare Daily  . folic acid  1 mg Oral Daily  . furosemide  40 mg Intravenous Once  . gabapentin  400 mg Oral BID  . guaiFENesin  1,200 mg Oral BID  . insulin aspart  0-5 Units Subcutaneous QHS  . insulin aspart  0-9 Units Subcutaneous TID WC  . ipratropium  0.5 mg Nebulization TID  . levalbuterol  0.63 mg Nebulization TID  . loratadine  10 mg Oral Daily  . mesalamine  2.4 g Oral Q breakfast  . methylPREDNISolone (SOLU-MEDROL) injection  60 mg Intravenous Q8H  . mometasone-formoterol  2 puff Inhalation BID  . multivitamin with minerals  1 tablet Oral Daily  . pantoprazole  40 mg Oral Daily  . sodium chloride flush  3 mL Intravenous Q12H  . thiamine  100 mg Oral Daily   Continuous Infusions: . sodium chloride    . azithromycin 500 mg (04/09/19 0357)    LOS: 1 day   Kerney Elbe, DO [facility] PAGER is on [place]  If 7PM-7AM, please contact night-coverage [email or web address] Password [facility] 04/09/2019, 4:38 PM

## 2019-04-09 NOTE — TOC Initial Note (Signed)
Transition of Care [facility]) - Initial/Assessment Note    Patient Details  Name: Eddie Rivera MRN: [id number] Date of Birth: 11-08-1945  [facility]) CM/SW Contact:    Bethena Roys, RN Phone Number: 04/09/2019, 4:09 PM  Clinical Narrative:   Pt presented for COPD exacerbation. PTA from home alone on 02 at 4 Liters via [place]. CM did make the [facility] aware that patient was hospitalized. Pt has DME 02, Cane, RW, CPAP at home. However, patient states that CPAP is not working and had an appointment at the [place] to get refit for new CPAP. The CSW did not have record of this. Patient will benefit from a Life Alert button when he transitions home-patient is agreeable. CM did discuss with CSW with [facility]. Patient will need a Rx faxed for Life Alert to [phone] and she can start working on the DME. If patient has new medication Rx the fax number to the Pharmacy is  [phone].  Patient states he gets his medications free at the [place] because he 100% disabled. CM did discuss need for [place]- patient states he has used [facility] in the past and wants to use again. CM did call referral- Kendra at [facility] will need to verify if orders can be signed from [place]. Pt will need HH Orders/ F2F for  RN/?PT once stable to transition home. If patient is transitioned home over the weekend patient will need [place] orders faxed to [phone]. CM will continue to monitor for additional transition of care needs.           Expected Discharge Plan: [place] Barriers to Discharge: Continued Medical Work up   Patient Goals and CMS Choice Patient states their goals for this hospitalization and ongoing recovery are:: "to get back home" CMS Medicare.gov Compare Post Acute Care list provided to:: Patient Choice offered to / list presented to : Patient  Expected Discharge Plan and Services Expected Discharge  Plan: [place] In-house Referral: NA Discharge Planning Services: CM Consult Post Acute Care Choice: [place] arrangements for the past 2 months: Single Family Home                   HH Arranged: RN, Disease Management, PT HH Agency: [place] Date [place]: 04/09/19 Time HH Agency Contacted: 1030 Representative spoke with at [place] Arrangements/Services Living arrangements for the past 2 months: [place] with:: Self Patient language and need for interpreter reviewed:: Yes Do you feel safe going back to the place where you live?: Yes      Need for Family Participation in Patient Care: Yes (Comment) Care giver support system in place?: Yes (comment) Current home services: DME(Pt has DME CPAP- RW, Cane and 02 via 4 Liters via [employer].) Criminal Activity/Legal Involvement Pertinent to Current Situation/Hospitalization: No - Comment as needed  Activities of Daily Living      Permission Sought/Granted Permission sought to share information with : Facility Sport and exercise psychologist, Family Supports Permission granted to share information with : Yes, Verbal Permission Granted     Permission granted to share info w AGENCY: [facility]        Emotional Assessment Appearance:: Appears stated age Attitude/Demeanor/Rapport: Engaged Affect (typically observed): Accepting Orientation: : Oriented to  Time, Oriented to Place, Oriented to Situation, Oriented to Self Alcohol / Substance Use:  Not Applicable Psych Involvement: No (comment)  Admission diagnosis:  Acute on chronic respiratory failure with hypercapnia (HCC) [J96.22] Patient Active Problem List   Diagnosis Date Noted  . COPD exacerbation ([place]) 04/08/2019  . OSA (obstructive sleep apnea) 04/08/2019  . Essential hypertension 04/08/2019  . COPD (chronic obstructive pulmonary disease) ([place]) 04/04/2019   . Acute on chronic respiratory failure with hypoxia and hypercapnia ([place]) 04/04/2019  . Crohn's colitis ([place]) 04/04/2019  . Atrial fibrillation with RVR ([place]) 04/04/2019  . Encephalopathy acute 04/02/2019  . Hypoxemic respiratory failure, chronic ([place]) 04/02/2019  . Respiratory failure ([place]) 04/02/2019   PCP:  Patient, No Pcp Per Pharmacy:   [place], [place] [address] Phone: [phone] Fax: [phone]     Social Determinants of Health (SDOH) Interventions    Readmission Risk Interventions No flowsheet data found.

## 2019-04-09 NOTE — Progress Notes (Signed)
Called to patient room due to RN stating that patient is in need of BiPAP. On arrival noted All vital signs to be stable. Patient is alert and oriented at this time and able to follow commands appropriately. Able to state his name, whereabouts, age, and DOB. No signs of respiratory distress noted. No respiratory compromise noted. Patient has not had any increase in O2 requirement. No hypoxia noted.  On my assessment NIV is not clinically indicated at this time. Furthermore patient states that he is not using that mask. Stating he will be uncomfortable. Learned through RN that patient does part-take in daily drinking, possibly patient maybe having some "withdrawals" that may support the nursing assessment of bouts of confusion. RRT will monitor patient as needed.   Eddie Rivera, RRT, RCP

## 2019-04-09 NOTE — Progress Notes (Signed)
Patient continues to have bouts of confusion, now thinks that it is daylight hours and that he is to be discharged, Patient re-oriented and assessed.  Patient does endorse daily drinking at home. Awaiting further orders

## 2019-04-09 NOTE — Progress Notes (Signed)
ANTICOAGULATION CONSULT NOTE - Initial Consult  Pharmacy Consult for Eliquis Indication: atrial fibrillation  Allergies  Allergen Reactions  . Atorvastatin Other (See Comments)    Patient doesn't recall the reaction (??)    Patient Measurements: Height: 6' (182.9 cm) Weight: 266 lb 5.1 oz (120.8 kg) IBW/kg (Calculated) : 77.6  Vital Signs: Temp: 98 F (36.7 C) (07/30 1618) Temp Source: Oral (07/30 1618) BP: 114/80 (07/30 1618) Pulse Rate: 84 (07/30 1618)  Labs: Recent Labs    04/08/19 0006  04/08/19 0246 04/08/19 0410 04/08/19 0720 04/09/19 0350  HGB 13.2   < > 15.3 13.5  --  12.5*  HCT 46.7   < > 45.0 47.8  --  42.4  PLT 220  --   --  235  --  248  CREATININE 0.84  --   --  0.93  --  0.90  TROPONINIHS 7  --   --  8 7  --    < > = values in this interval not displayed.    Estimated Creatinine Clearance: 101.1 mL/min (by C-G formula based on SCr of 0.9 mg/dL).   Medical History: Past Medical History:  Diagnosis Date  . Atrial fibrillation ([place])   . COPD (chronic obstructive pulmonary disease) ([place])   . Crohn's colitis ([place])   . Hypertension   . Lung cancer (HCC)    LLL  . OSA (obstructive sleep apnea)    Assessment:  73 yr old male to begin Eliquis for atrial fibrillation. No anticoagulation prior to admission. Has been on Aspirin 325 mg, which was given this am, now discontinued.  Lovenox 40 mg SQ given this am for VTE prophylaxis.      120 kg, creatinine 0.84, hemoglobin 12.5, platelet count 248.  No bleeding noted. Hx ETOH.  Goal of Therapy:  appropriate Eliquis dose for indication Monitor platelets by anticoagulation protocol: Yes   Plan:   Eliquis 5 mg PO BID.  Intermittent CBC.  Monitor for s/sx bleeding.  Arty Baumgartner, [place] Pager: [phone] or phone: [phone] 04/09/2019,7:13 PM

## 2019-04-10 ENCOUNTER — Encounter (HOSPITAL_COMMUNITY): Payer: Self-pay | Admitting: General Practice

## 2019-04-10 ENCOUNTER — Inpatient Hospital Stay (HOSPITAL_COMMUNITY): Payer: No Typology Code available for payment source

## 2019-04-10 DIAGNOSIS — J851 Abscess of lung with pneumonia: Secondary | ICD-10-CM

## 2019-04-10 DIAGNOSIS — Z515 Encounter for palliative care: Secondary | ICD-10-CM

## 2019-04-10 DIAGNOSIS — I5033 Acute on chronic diastolic (congestive) heart failure: Secondary | ICD-10-CM

## 2019-04-10 LAB — CBC WITH DIFFERENTIAL/PLATELET
Abs Immature Granulocytes: 0.08 10*3/uL — ABNORMAL HIGH (ref 0.00–0.07)
Basophils Absolute: 0 10*3/uL (ref 0.0–0.1)
Basophils Relative: 0 %
Eosinophils Absolute: 0 10*3/uL (ref 0.0–0.5)
Eosinophils Relative: 0 %
HCT: 41.8 % (ref 39.0–52.0)
Hemoglobin: 12.6 g/dL — ABNORMAL LOW (ref 13.0–17.0)
Immature Granulocytes: 1 %
Lymphocytes Relative: 8 %
Lymphs Abs: 1.1 10*3/uL (ref 0.7–4.0)
MCH: 29 pg (ref 26.0–34.0)
MCHC: 30.1 g/dL (ref 30.0–36.0)
MCV: 96.1 fL (ref 80.0–100.0)
Monocytes Absolute: 0.6 10*3/uL (ref 0.1–1.0)
Monocytes Relative: 5 %
Neutro Abs: 12.3 10*3/uL — ABNORMAL HIGH (ref 1.7–7.7)
Neutrophils Relative %: 86 %
Platelets: 275 10*3/uL (ref 150–400)
RBC: 4.35 MIL/uL (ref 4.22–5.81)
RDW: 14.6 % (ref 11.5–15.5)
WBC: 14.1 10*3/uL — ABNORMAL HIGH (ref 4.0–10.5)
nRBC: 0.1 % (ref 0.0–0.2)

## 2019-04-10 LAB — GLUCOSE, CAPILLARY
Glucose-Capillary: 191 mg/dL — ABNORMAL HIGH (ref 70–99)
Glucose-Capillary: 160 mg/dL — ABNORMAL HIGH (ref 70–99)
Glucose-Capillary: 173 mg/dL — ABNORMAL HIGH (ref 70–99)
Glucose-Capillary: 187 mg/dL — ABNORMAL HIGH (ref 70–99)

## 2019-04-10 LAB — COMPREHENSIVE METABOLIC PANEL
ALT: 25 U/L (ref 0–44)
AST: 15 U/L (ref 15–41)
Albumin: 3.1 g/dL — ABNORMAL LOW (ref 3.5–5.0)
Alkaline Phosphatase: 59 U/L (ref 38–126)
Anion gap: 10 (ref 5–15)
BUN: 25 mg/dL — ABNORMAL HIGH (ref 8–23)
CO2: 37 mmol/L — ABNORMAL HIGH (ref 22–32)
Calcium: 8.8 mg/dL — ABNORMAL LOW (ref 8.9–10.3)
Chloride: 89 mmol/L — ABNORMAL LOW (ref 98–111)
Creatinine, Ser: 0.96 mg/dL (ref 0.61–1.24)
GFR calc Af Amer: >60 mL/min (ref >60)
GFR calc non Af Amer: >60 mL/min (ref >60)
Glucose, Bld: 183 mg/dL — ABNORMAL HIGH (ref 70–99)
Potassium: 4.4 mmol/L (ref 3.5–5.1)
Sodium: 136 mmol/L (ref 135–145)
Total Bilirubin: 0.6 mg/dL (ref 0.3–1.2)
Total Protein: 6.2 g/dL — ABNORMAL LOW (ref 6.5–8.1)

## 2019-04-10 LAB — PHOSPHORUS: Phosphorus: 3 mg/dL (ref 2.5–4.6)

## 2019-04-10 LAB — MAGNESIUM: Magnesium: 2.2 mg/dL (ref 1.7–2.4)

## 2019-04-10 MED ORDER — SODIUM CHLORIDE 0.9 % IV SOLN
1.0000 g | INTRAVENOUS | Status: DC
  Administered 2019-04-10 – 2019-04-12 (×3): 1 g via INTRAVENOUS
  Filled 2019-04-10 (×3): qty 1

## 2019-04-10 MED ORDER — FUROSEMIDE 10 MG/ML IJ SOLN
40.0000 mg | Freq: Two times a day (BID) | INTRAMUSCULAR | Status: DC
  Administered 2019-04-10 – 2019-04-11 (×2): 40 mg via INTRAVENOUS
  Filled 2019-04-10 (×2): qty 4

## 2019-04-10 MED ORDER — MORPHINE SULFATE (CONCENTRATE) 10 MG/0.5ML PO SOLN: 5.0000 mg | ORAL | Status: DC | PRN

## 2019-04-10 MED ORDER — ARFORMOTEROL TARTRATE 15 MCG/2ML IN NEBU
15.0000 ug | INHALATION_SOLUTION | Freq: Two times a day (BID) | RESPIRATORY_TRACT | Status: DC
  Administered 2019-04-10 – 2019-04-12 (×4): 15 ug via RESPIRATORY_TRACT
  Filled 2019-04-10 (×4): qty 2

## 2019-04-10 MED ORDER — BUDESONIDE 0.25 MG/2ML IN SUSP
0.2500 mg | Freq: Two times a day (BID) | RESPIRATORY_TRACT | Status: DC
  Administered 2019-04-10 – 2019-04-12 (×4): 0.25 mg via RESPIRATORY_TRACT
  Filled 2019-04-10 (×4): qty 2

## 2019-04-10 MED ORDER — RIVAROXABAN 20 MG PO TABS
20.0000 mg | ORAL_TABLET | Freq: Every day | ORAL | Status: DC
  Administered 2019-04-10 – 2019-04-12 (×3): 20 mg via ORAL
  Filled 2019-04-10 (×3): qty 1

## 2019-04-10 NOTE — Consult Note (Addendum)
CARDIOLOGY CONSULT NOTE  Patient ID: Eddie Rivera MRN: [id number] DOB/AGE: [birth year] 73 y.o.  Admit date: 04/07/2019 Referring Physician: [facility] Reason for Consultation:     HPI:   73 y.o. Caucasian male  with hypertension, type 2 DM, COPD on home oxygen, OSA, former smoker, h/o lung cancer, Chron's disease, admitted with worsening shortness of breath. Cardiology consulted for recommendations regarding anticoagulation.   Patient has been having worsening shortness of breath for last few days. Currently, his rate is fairly well controlled on carvedilol and diltiazem. Patient was previously seen by cardiologist at [facility]. He states that he has not seen them since 2014. In the past, he was started on eliquis 5 mg bid, but stopped it due to bruising on his hands. Sine then, he has been on aspirin 325 mg.   Echocardiogram during this hospitalization showed preserved EF. Diastolic function could not be assessed due to Afib.   Past Medical History:  Diagnosis Date  . Atrial fibrillation ([place])   . COPD (chronic obstructive pulmonary disease) ([place])   . Crohn's colitis ([place])   . Diabetes mellitus without complication ([name])   . Hypertension   . Lung cancer ([place])    LLL  . OSA (obstructive sleep apnea)      Past Surgical History:  Procedure Laterality Date  . HEMORRHOID SURGERY       Family History  Problem Relation Age of Onset  . Dementia Mother   . Cervical cancer Mother   . Liver cancer Father      Social History: Social History   Socioeconomic History  . Marital status: Divorced    Spouse name: Not on file  . Number of children: Not on file  . Years of education: Not on file  . Highest education level: Not on file  Occupational History  . Not on file  Social Needs  . Financial resource strain: Not on file  . Food insecurity    Worry: Not on file    Inability: Not on file  . Transportation needs    Medical: Not on file    Non-medical: Not on  file  Tobacco Use  . Smoking status: Former Smoker    Types: Cigarettes  . Smokeless tobacco: Never Used  Substance and Sexual Activity  . Alcohol use: Not Currently  . Drug use: Never  . Sexual activity: Not on file  Lifestyle  . Physical activity    Days per week: Not on file    Minutes per session: Not on file  . Stress: Not on file  Relationships  . Social Herbalist on phone: Not on file    Gets together: Not on file    Attends religious service: Not on file    Active member of club or organization: Not on file    Attends meetings of clubs or organizations: Not on file    Relationship status: Not on file  . Intimate partner violence    Fear of current or ex partner: Not on file    Emotionally abused: Not on file    Physically abused: Not on file    Forced sexual activity: Not on file  Other Topics Concern  . Not on file  Social History Narrative  . Not on file     Medications Prior to Admission  Medication Sig Dispense Refill Last Dose  . albuterol (PROAIR HFA) 108 (90 Base) MCG/ACT inhaler Inhale 2 puffs into the lungs every 6 (six) hours as needed  for wheezing or shortness of breath.   04/07/2019 at prn  . albuterol (PROVENTIL) (2.5 MG/3ML) 0.083% nebulizer solution Take 2.5 mg by nebulization See admin instructions. Nebulize 1 vial every 4-6 hours as needed for shortness of breath or wheezing   Past Week at prn  . aspirin EC 325 MG tablet Take 325 mg by mouth daily.   04/08/2019 at Unknown time  . budesonide-formoterol (SYMBICORT) 160-4.5 MCG/ACT inhaler Inhale 2 puffs into the lungs 2 (two) times daily.   04/07/2019 at Unknown time  . carvedilol (COREG) 12.5 MG tablet Take 12.5 mg by mouth 2 (two) times daily with a meal.   03/25/2019  . cetirizine (ZYRTEC) 10 MG tablet Take 10 mg by mouth daily.   04/07/2019 at Unknown time  . clotrimazole (LOTRIMIN) 1 % cream Apply 1 application topically See admin instructions. Apply a small amount daily as directed/as needed  to legs and feet   unknown at prn  . fluticasone (FLONASE) 50 MCG/ACT nasal spray Place 2 sprays into both nostrils daily.   04/07/2019 at Unknown time  . gabapentin (NEURONTIN) 400 MG capsule Take 400 mg by mouth 2 (two) times daily.    04/07/2019 at Unknown time  . loperamide (IMODIUM) 2 MG capsule Take 4 mg by mouth 2 (two) times daily as needed for diarrhea or loose stools.   unknown at prn  . mesalamine (LIALDA) 1.2 g EC tablet Take 2.4 g by mouth daily with breakfast.   04/07/2019 at Unknown time  . omeprazole (PRILOSEC) 40 MG capsule Take 40 mg by mouth daily before breakfast.    04/07/2019 at Unknown time  . tiotropium (SPIRIVA) 18 MCG inhalation capsule Place 18 mcg into inhaler and inhale daily.   04/07/2019 at Unknown time    Review of Systems  Constitution: Negative for decreased appetite, malaise/fatigue, weight gain and weight loss.  HENT: Negative for congestion.   Eyes: Negative for visual disturbance.  Cardiovascular: Positive for dyspnea on exertion and leg swelling. Negative for chest pain, palpitations and syncope.  Respiratory: Positive for shortness of breath. Negative for cough.   Endocrine: Negative for cold intolerance.  Hematologic/Lymphatic: Does not bruise/bleed easily.  Skin: Negative for itching and rash.  Musculoskeletal: Negative for myalgias.  Gastrointestinal: Negative for abdominal pain, nausea and vomiting.  Genitourinary: Negative for dysuria.  Neurological: Negative for dizziness and weakness.  Psychiatric/Behavioral: The patient is not nervous/anxious.   All other systems reviewed and are negative.     Physical Exam: Physical Exam  Constitutional: He is oriented to person, place, and time. He appears well-developed and well-nourished. No distress.  HENT:  Head: Normocephalic and atraumatic.  Eyes: Pupils are equal, round, and reactive to light. Conjunctivae are normal.  Neck: No JVD present.  Cardiovascular: Normal rate and intact distal pulses. An  irregularly irregular rhythm present.  Pulmonary/Chest: Effort normal and breath sounds normal. He has no wheezes. He has no rales.  Abdominal: Soft. Bowel sounds are normal. There is no rebound.  Musculoskeletal:        General: Edema (1+ b/l) present.  Lymphadenopathy:    He has no cervical adenopathy.  Neurological: He is alert and oriented to person, place, and time. No cranial nerve deficit.  Skin: Skin is warm and dry.  Psychiatric: He has a normal mood and affect.  Nursing note and vitals reviewed.    Labs:   Lab Results  Component Value Date   WBC 14.1 (H) 04/10/2019   HGB 12.6 (L) 04/10/2019   HCT  41.8 04/10/2019   MCV 96.1 04/10/2019   PLT 275 04/10/2019    Recent Labs  Lab 04/10/19 0350  NA 136  K 4.4  CL 89*  CO2 37*  BUN 25*  CREATININE 0.96  CALCIUM 8.8*  PROT 6.2*  BILITOT 0.6  ALKPHOS 59  ALT 25  AST 15  GLUCOSE 183*    Lipid Panel  No results found for: CHOL, TRIG, HDL, CHOLHDL, VLDL, LDLCALC  BNP (last 3 results) Recent Labs    04/08/19 0410  BNP 110.5*    HEMOGLOBIN A1C Lab Results  Component Value Date   HGBA1C 7.4 (H) 04/09/2019   MPG 165.68 04/09/2019    Cardiac Panel (last 3 results) No results for input(s): CKTOTAL, CKMB, TROPONINI, RELINDX in the last 8760 hours.  No results found for: CKTOTAL, CKMB, CKMBINDEX, TROPONINI   TSH No results for input(s): TSH in the last 8760 hours.    Radiology: Dg Chest Port 1 View  Result Date: 04/10/2019 CLINICAL DATA:  73 year old male with a history of shortness of breath EXAM: PORTABLE CHEST 1 VIEW COMPARISON:  April 08, 2019 FINDINGS: Cardiomediastinal silhouette unchanged in size and contour. No interlobular septal thickening. No pneumothorax. Similar appearance of right basilar opacity with obscuration of the right hemidiaphragm and the right heart border. Pleuroparenchymal thickening along the periphery. Linear opacity at the left base persists. No displaced fracture. IMPRESSION:  Unchanged right basilar opacity likely a combination of atelectasis/consolidation and pleural effusion. Electronically Signed   By: Corrie Mckusick D.O.   On: 04/10/2019 08:50    Scheduled Meds: . arformoterol  15 mcg Nebulization BID  . budesonide (PULMICORT) nebulizer solution  0.25 mg Nebulization BID  . carvedilol  12.5 mg Oral BID WC  . diltiazem  120 mg Oral Daily  . fluticasone  2 spray Each Nare Daily  . folic acid  1 mg Oral Daily  . furosemide  40 mg Intravenous BID  . gabapentin  400 mg Oral BID  . guaiFENesin  1,200 mg Oral BID  . insulin aspart  0-15 Units Subcutaneous TID WC  . insulin aspart  0-5 Units Subcutaneous QHS  . ipratropium  0.5 mg Nebulization TID  . levalbuterol  0.63 mg Nebulization TID  . loratadine  10 mg Oral Daily  . mesalamine  2.4 g Oral Q breakfast  . methylPREDNISolone (SOLU-MEDROL) injection  60 mg Intravenous Q8H  . multivitamin with minerals  1 tablet Oral Daily  . pantoprazole  40 mg Oral Daily  . rivaroxaban  20 mg Oral Q supper  . sodium chloride flush  3 mL Intravenous Q12H  . thiamine  100 mg Oral Daily   Continuous Infusions: . sodium chloride    . azithromycin 500 mg (04/10/19 0645)  . cefTRIAXone (ROCEPHIN)  IV Stopped (04/10/19 1653)   PRN Meds:.sodium chloride, acetaminophen **OR** acetaminophen, albuterol, alum & mag hydroxide-simeth, loperamide, LORazepam, morphine CONCENTRATE, ondansetron (ZOFRAN) IV, sodium chloride flush  CARDIAC STUDIES:  EKG 04/08/2019: Afib w/RVR. No ischemic changes  Echocardiogram 04/08/2019:  1. The left ventricle has normal systolic function with an ejection fraction of 60-65%. The cavity size was mild to moderately dilated. There is mildly increased left ventricular wall thickness. Left ventricular diastolic function could not be evaluated  secondary to atrial fibrillation. Elevated left ventricular end-diastolic pressure.  2. The right ventricle was not well visualized. The cavity was normal. There  is no increase in right ventricular wall thickness. Right ventricular systolic pressure is mildly elevated.  3. Left atrial size  was mildly dilated.  4. The aortic valve is tricuspid. Mild sclerosis of the aortic valve. Aortic valve regurgitation was not assessed by color flow Doppler.  5. The aorta is normal in size and structure.  6. The inferior vena cava was dilated in size with >50% respiratory variability.   Assessment & Recommendations:  73 y.o. Caucasian male  with hypertension, type 2 DM, COPD home oxygen, OSA, former smoker, h/o lung cancer, Chron's disease, admitted with worsening shortness of breath. Cardiology consulted for recommendations regarding anticoagulation.   Afib: Longstanding persistent Afib.  Rate is fairly well controlled on diltiazem 120 mg and carvedilol 12.5 mg bid.  CHA2DS2Vasc score 5. Annual stroke risk 7%.  Aspirin 325 mg not recommended as per recent guidelines.He is reluctant to use eliquis. He is willing to try Xarelto. I explained risks/benefits of anticoagulation in setting of high stroke risk with Afib. Recommend Xarelto 20 mg daily.   Shortness of breath: Multifactorial with COPD and HFpEF.  Agree with IV lasix 40 mg bid at this time.   Nigel Mormon, MD 04/10/2019, 10:05 PM [facility] Pager: [phone] Office: [phone] If no answer Cell [phone]

## 2019-04-10 NOTE — Progress Notes (Signed)
PROGRESS NOTE    Eddie Rivera  YQM:[id number] DOB: Sep 02, 1946 DOA: 04/07/2019 PCP: Patient, No Pcp Per   Brief Narrative:  The patient is a 73 year old Caucasian [place] veteran with a past medical history significant for but not limited to Crohn's colitis, chronic hypoxic respiratory failure on 2 to 4 L of oxygen via nasal cannula history of significant COPD, OSA, history of prior lung cancer, former tobacco abuse smoker, history of atrial fibrillation unclear if he is on anticoagulation as well as other comorbidities who presents with a chief complaint of "blood pressure being off as well as breathing being off."  States that he is progressively gotten worsening dyspnea over the last week and states that his blood pressures been off.  States that he was unable to breathe so presented to the ED for further evaluation.  He was worked up and admitted for a COPD exacerbation was also found to be in A. fib with RVR and started on a Cardizem drip.  Patient's respiratory status is slowly improving and he continues remain volume overloaded so we will have Cardiology evaluate.  Because he had been frequently hospitalized will obtain a palliative care consultation for goals of care discussion.  Assessment & Plan:   Principal Problem:   COPD exacerbation ([place]) Active Problems:   Crohn's colitis ([place])   Atrial fibrillation with RVR (H[facility])   OSA (obstructive sleep apnea)   Essential hypertension   Acute Respiratory Failure on Chronic Respiratory Failure with Hypoxia, and Hypercapnea Secondary to COPD Exacerbation and ? HFpEF CHF from A Fib along with OSA and ? Right Lower PNA, poA -Admitted to SDU as an inpatient -ABG done showed a pH of 7.305/PCO2 of 78.1/PO2 of 233.0/bicarbonate level of 38.9/O2 saturation of 100% on BiPAP -Weaned off of BiPAP and now back on 4 L nasal cannula -Continuous pulse oximetry and maintain O2 saturations greater than 90% -Continue supplemental oxygen via nasal  cannula and wean to home dose -Placed on Solumedrol 80mg  iv q8h and have reduced dose to 60 mg IV q. 8 scheduled and will reduce to 60 mg IV q12h later this evening  -C/w Azithromycin 500mg  iv qday for least 5 days; started IV ceftriaxone given questionable pneumonia may need to escalate given that the patient has been hospitalized in last 90 days; since WBC is elevated likely in the setting of IV steroid demargination -Stop Spiriva 1puff qday and started the patient on Xopenex/ipratropium nebs every 6 scheduled -Dulera substitution to stop and start the patient on budesonide as well as Brovana -Albuterol neb q6h stopped due to Xopenex and q6h prn  -Started guaifenesin 1200 mg p.o. twice daily -Continue with flutter valve, incentive spirometry -Started twice daily dosing of IV Lasix 40 mg -We will check strict I's and O's, daily weights; Patient is -2.067 Liters since admission -BNP was 110.5 -Continue supplemental oxygen via nasal cannula and wean O2 as tolerated to home dose -CXR on Admission showed "Right lower lobe opacity, likely reflecting a combination of atelectasis and right pleural effusion, mildly progressive." -Repeat chest x-ray this AM showed "Unchanged right basilar opacity likely a combination of atelectasis/consolidation and pleural effusion." -We will place the patient on CPAP at night -Will discuss with Cardiology and appreciate formal evaluation -We will also obtain a Palliative Consultation given patient's multiple admissions the last few weeks  Afib with RVR  -Has an elevated CHA2DS2-VASc is at least a 3 or 4 -C/w Telemetry Monitoring  -Trop I q3h x2 flat and negative -Check TSH in AM  -  Check Cardiac ECHOCardiogram and showed left ventricule systolic function with an EF of 60 to 65% left ventricular diastolic function could not be evaluated due to A. fib.  There is no increase in right ventricle wall thickness and right ventricular systolic pressure was mildly elevated.   There is musculatures of the aortic valve and aortic valve regurgitation was not assessed by color Doppler flow inferior vena cava was dilated in size with greater than 50% respiratory variability -Discussed the case with cardiology Dr. Randa Lynn who recommends discontinuing aspirin 325 mg po Daily and starting the patient on apixaban 5 mg p.o. twice daily -Unclear why not on anticoagulation and will be obtaining records from the [facility] but could not find reasons are started on her -Cont Carvedilol 12.5mg  po bid -C/w Cardizem gtt and transition to po 120 mg Cardizem CD; May need to Titrate up -Will need Cardiology Evaluation and further recommendations about Anticoagulation and have consulted Dr. Virgina Jock for further evaluation; Dr. Virgina Jock recommended initially apixaban but after discussing with the patient patient did not want apixaban due to bruising so we will start the patient on Xarelto 20 mg daily per cardiology recommendations  Volume Overload with ? Diastolic Heart Failure with Preserved EF -We will check a Cardiac Echocardiogram and systolic function was normal but diastolic function could not be assessed due to A. fib -Checked BNP and was 110. -Strict I's and O's and daily weights; fluid restrict to 1500 mL's -Given Diuresis with Lasix 40 mg and changed to IV BID today  -We will have cardiology weigh in for formal evaluation -Patient is -2.067 Liters  -Continue to monitor for signs of volume overload  Crohns Colitis -Cont Mesalamine 0.4 g daily with breakfast  GERD -Cont PPI Omeprazole Substitution with Pantoprazole  Hyperglycemia in the setting of Diabetes Mellitus Type 2 -Hemoglobin A1c was 7.4 -Blood sugars are likely worsened in the setting of IV steroid demargination -Sugars have been ranging from 181-227 -Placed on a sensitive NovoLog sliding scale and have now increases to moderate NovoLog sliding scale before meals and at bedtime  Obesity -Estimated body  mass index is 36.24 kg/m as calculated from the following:   Height as of this encounter: 6' (1.829 m).   Weight as of this encounter: 121.2 kg. -Weight Loss and Dietary Counseling Given  Metabolic Alkalosis -Likely in the setting of diuresis and COPD -Chloride was 89 and CO2 was 37 -We will need to continue to monitor carefully -Continue monitor and trend and repeat CMP in a.m.  Leukocytosis -Patient's WBC went from 12.7 -> 13.0 -> 14.1 -Setting of IV steroid demargination and reactive from acute exacerbation of COPD next-continue monitor for signs and symptoms of infection Continue with IV azithromycin -Repeat CBC in a.m.  Macrocytic/Normocytic Anemia -Patient's hemoglobin/hematocrit went from 13.5/47.8 and is now 12.6/41.8 -Check anemia panel in the a.m. -We will monitor for signs and symptoms of bleeding; currently no overt bleeding noted -Repeat CBC in AM   History of Alcohol Abuse -Placed on CIWA protocol and will need to continue to Monitor Closely -Continue with folic acid, multivitamin as well as thiamine -Monitor CIWA scores pretty carefully  DVT prophylaxis: Enoxaparin 60 mg sq Daily stopped and now changed to anticoagulation with apixaban after discussion with cardiology however patient preference and cardiology changed her mind to Xarelto Code Status: FULL CODE Family Communication: No family present at bedside  Disposition Plan: Pending further work-up and improvement in clinical status as well as evaluation by palliative care medicine and Cardiology  Consultants:   Palliative Care Medicine   Cardiology Dr. Virgina Jock    Procedures:  ECHOCARDIOGRAM IMPRESSIONS    1. The left ventricle has normal systolic function with an ejection fraction of 60-65%. The cavity size was mild to moderately dilated. There is mildly increased left ventricular wall thickness. Left ventricular diastolic function could not be evaluated  secondary to atrial fibrillation.  Elevated left ventricular end-diastolic pressure.  2. The right ventricle was not well visualized. The cavity was normal. There is no increase in right ventricular wall thickness. Right ventricular systolic pressure is mildly elevated.  3. Left atrial size was mildly dilated.  4. The aortic valve is tricuspid. Mild sclerosis of the aortic valve. Aortic valve regurgitation was not assessed by color flow Doppler.  5. The aorta is normal in size and structure.  6. The inferior vena cava was dilated in size with >50% respiratory variability.  FINDINGS  Left Ventricle: The left ventricle has normal systolic function, with an ejection fraction of 60-65%. The cavity size was mild to moderately dilated. There is mildly increased left ventricular wall thickness. Left ventricular diastolic function could  not be evaluated secondary to atrial fibrillation. Elevated left ventricular end-diastolic pressure  Right Ventricle: The right ventricle was not well visualized. The cavity was normal. There is no increase in right ventricular wall thickness. Right ventricular systolic pressure is mildly elevated.  Left Atrium: Left atrial size was mildly dilated.  Right Atrium: Right atrial size was normal in size.  Interatrial Septum: No atrial level shunt detected by color flow Doppler.  Pericardium: There is no evidence of pericardial effusion.  Mitral Valve: The mitral valve is normal in structure. Mitral valve regurgitation was not assessed by color flow Doppler.  Tricuspid Valve: The tricuspid valve is normal in structure. Tricuspid valve regurgitation is mild by color flow Doppler.  Aortic Valve: The aortic valve is tricuspid Mild sclerosis of the aortic valve. Aortic valve regurgitation was not assessed by color flow Doppler.  Pulmonic Valve: The pulmonic valve was normal in structure. Pulmonic valve regurgitation is not visualized by color flow Doppler.  Aorta: The aorta is normal in size  and structure.  Venous: The inferior vena cava measures 2.43 cm, is dilated in size with greater than 50% respiratory variability.    +--------------+--------++ LEFT VENTRICLE         +----------------+----------++ +--------------+--------++ Diastology                 PLAX 2D                +----------------+----------++ +--------------+--------++ LV e' lateral:  12.40 cm/s LVIDd:        6.10 cm  +----------------+----------++ +--------------+--------++ LV E/e' lateral:9.0        LVIDs:        5.20 cm  +----------------+----------++ +--------------+--------++ LV e' medial:   6.09 cm/s  LV PW:        1.20 cm  +----------------+----------++ +--------------+--------++ LV E/e' medial: 18.2       LV IVS:       1.10 cm  +----------------+----------++ +--------------+--------++ LVOT diam:    2.20 cm  +--------------+--------++ LV SV:        57 ml    +--------------+--------++ LV SV Index:  23.02    +--------------+--------++ LVOT Area:    3.80 cm +--------------+--------++                        +--------------+--------++  +---------------+---------++ RIGHT VENTRICLE          +---------------+---------++  RV Basal diam: 3.20 cm   +---------------+---------++ RV S prime:    7.40 cm/s +---------------+---------++ TAPSE (M-mode):2.9 cm    +---------------+---------++ RVSP:          37.3 mmHg +---------------+---------++  +---------------+-------++-----------++ LEFT ATRIUM           Index       +---------------+-------++-----------++ LA diam:       4.20 cm1.76 cm/m  +---------------+-------++-----------++ LA Vol (A2C):  65.8 ml27.57 ml/m +---------------+-------++-----------++ LA Vol (A4C):  64.1 ml26.86 ml/m +---------------+-------++-----------++ LA Biplane Vol:65.1 ml27.28 ml/m +---------------+-------++-----------++  +------------+---------++-----------++ RIGHT ATRIUM         Index       +------------+---------++-----------++ RA Pressure:8.00 mmHg            +------------+---------++-----------++ RA Area:    15.60 cm            +------------+---------++-----------++ RA Volume:  35.80 ml 15.00 ml/m +------------+---------++-----------++  +------------+-----------++ AORTIC VALVE            +------------+-----------++ LVOT Vmax:  99.00 cm/s  +------------+-----------++ LVOT Vmean: 59.600 cm/s +------------+-----------++ LVOT VTI:   0.159 m     +------------+-----------++   +-------------+-------++ AORTA                +-------------+-------++ Ao Root diam:3.60 cm +-------------+-------++  +--------------+----------++  +---------------+-----------++ MITRAL VALVE              TRICUSPID VALVE            +--------------+----------++  +---------------+-----------++ MV Area (PHT):6.63 cm    TR Peak grad:  29.3 mmHg   +--------------+----------++  +---------------+-----------++ MV PHT:       33.20 msec  TR Vmax:       276.00 cm/s +--------------+----------++  +---------------+-----------++ MV Decel Time:115 msec    Estimated RAP: 8.00 mmHg   +--------------+----------++  +---------------+-----------++ +--------------+-----------++ RVSP:          37.3 mmHg   MV E velocity:111.00 cm/s +---------------+-----------++ +--------------+-----------++                               +--------------+-------+                               SHUNTS                                              +--------------+-------+                               Systemic VTI: 0.16 m                                +--------------+-------+                               Systemic Diam:2.20 cm                               +--------------+-------+  +---------+-------+ IVC              +---------+-------+ IVC diam:2.43 cm  +---------+-------+  Antimicrobials:  Anti-infectives (From admission,  onward)   Start     Dose/Rate Route Frequency Ordered Stop   04/08/19 0400  azithromycin (ZITHROMAX) 500 mg in sodium chloride 0.9 % 250 mL IVPB     500 mg 250 mL/hr over 60 Minutes Intravenous Every 24 hours 04/08/19 0332       Subjective: Seen and examined and states his breathing is not much better.  Denies any chest pain, lightheadedness or dizziness but states that his legs are still swollen.  Appears frustrated and states that he attempted to use his CPAP last night but because of his congestion was not able to tolerate much.  No other concerns or complaints at this time.  Objective: Vitals:   04/10/19 0921 04/10/19 1348 04/10/19 1351 04/10/19 1437  BP: 116/83   96/72  Pulse: 87   88  Resp:    20  Temp:    97.9 F (36.6 C)  TempSrc:    Oral  SpO2: 96% 98% 98% 98%  Weight:      Height:        Intake/Output Summary (Last 24 hours) at 04/10/2019 1447 Last data filed at 04/10/2019 1100 Gross per 24 hour  Intake 1778.45 ml  Output 3350 ml  Net -1571.55 ml   Filed Weights   04/09/19 0314 04/10/19 0524  Weight: 120.8 kg 121.2 kg   Examination: Physical Exam:  Constitutional: Well-nourished, well-developed obese Caucasian male currently no acute distress sitting up in bed appears calm but he does appear mildly uncomfortable. Eyes: Lids and conjunctive are normal.  Sclera icteric ENMT: External ears nose appear normal for grossly normal hearing.  Mucous members are moist; has poor dentition Neck: Appears supple with no real appreciable JVD but is difficult to assess secondary to body habitus Respiratory: Diminished auscultation bilaterally no patient wheezing, rales, rhonchi.  Has some coarse breath sounds.  Has a normal respiratory effort and is not tachypneic but is wearing 4 L supplemental oxygen via nasal Cardiovascular: Irregularly irregular but rate controlled today.  No appreciable murmurs, rubs,  gallops.  Has 1-2+ lower extremity pitting edema Abdomen: Soft, nontender, distended secondary body habitus.  Bowel sounds present GU: Deferred Musculoskeletal: No contractures or cyanosis.  No joint deformities in the upper and lower extremities Skin:.  No appreciable rashes or lesions on limited skin evaluation.  Skin is warm and dry Neurologic: Cranial nerves II through XII gross intact no appreciable focal deficit Psychiatric: Appears somewhat anxious but has an appropriate affect.  Normal judgment insight.  Patient is awake, alert, oriented times  Data Reviewed: I have personally reviewed following labs and imaging studies  CBC: Recent Labs  Lab 04/05/19 0734 04/08/19 0006 04/08/19 0054 04/08/19 0246 04/08/19 0410 04/09/19 0350 04/10/19 0350  WBC 9.1 12.9*  --   --  12.7* 13.0* 14.1*  NEUTROABS  --  9.5*  --   --   --  11.6* 12.3*  HGB 14.2 13.2 15.0 15.3 13.5 12.5* 12.6*  HCT 47.8 46.7 44.0 45.0 47.8 42.4 41.8  MCV 97.4 103.5*  --   --  103.5* 99.1 96.1  PLT 215 220  --   --  235 248 371   Basic Metabolic Panel: Recent Labs  Lab 04/05/19 0734 04/08/19 0006 04/08/19 0054 04/08/19 0246 04/08/19 0410 04/09/19 0350 04/10/19 0350  NA 138 139 139 136 140 139 136  K 5.8* 5.0 4.4 4.5 5.1 5.0 4.4  CL 97* 97*  --   --  97* 91* 89*  CO2 32 35*  --   --  35* 38* 37*  GLUCOSE 119* 139*  --   --  137* 185* 183*  BUN 17 11  --   --  8 18 25*  CREATININE 1.06 0.84  --   --  0.93 0.90 0.96  CALCIUM 8.6* 8.8*  --   --  8.8* 9.0 8.8*  MG  --   --   --   --   --  2.0 2.2  PHOS  --   --   --   --   --  2.6 3.0   GFR: Estimated Creatinine Clearance: 94.8 mL/min (by C-G formula based on SCr of 0.96 mg/dL). Liver Function Tests: Recent Labs  Lab 04/08/19 0410 04/09/19 0350 04/10/19 0350  AST 24 17 15   ALT 30 26 25   ALKPHOS 79 68 59  BILITOT 1.0 0.7 0.6  PROT 7.0 6.3* 6.2*  ALBUMIN 3.4* 3.1* 3.1*   No results for input(s): LIPASE, AMYLASE in the last 168 hours. No results  for input(s): AMMONIA in the last 168 hours. Coagulation Profile: No results for input(s): INR, PROTIME in the last 168 hours. Cardiac Enzymes: No results for input(s): CKTOTAL, CKMB, CKMBINDEX, TROPONINI in the last 168 hours. BNP (last 3 results) No results for input(s): PROBNP in the last 8760 hours. HbA1C: Recent Labs    04/09/19 0350  HGBA1C 7.4*   CBG: Recent Labs  Lab 04/09/19 1118 04/09/19 1621 04/09/19 2134 04/10/19 0737 04/10/19 1137  GLUCAP 192* 181* 173* 191* 160*   Lipid Profile: No results for input(s): CHOL, HDL, LDLCALC, TRIG, CHOLHDL, LDLDIRECT in the last 72 hours. Thyroid Function Tests: No results for input(s): TSH, T4TOTAL, FREET4, T3FREE, THYROIDAB in the last 72 hours. Anemia Panel: No results for input(s): VITAMINB12, FOLATE, FERRITIN, TIBC, IRON, RETICCTPCT in the last 72 hours. Sepsis Labs: No results for input(s): PROCALCITON, LATICACIDVEN in the last 168 hours.  Recent Results (from the past 240 hour(s))  MRSA PCR Screening     Status: Abnormal   Collection Time: 04/02/19 10:57 AM   Specimen: Nasal Mucosa; Nasopharyngeal  Result Value Ref Range Status   MRSA by PCR POSITIVE (A) NEGATIVE Final    Comment:        The GeneXpert MRSA Assay (FDA approved for NASAL specimens only), is one component of a comprehensive MRSA colonization surveillance program. It is not intended to diagnose MRSA infection nor to guide or monitor treatment for MRSA infections. RESULT CALLED TO, READ BACK BY AND VERIFIED WITH: Cleon Dew RN 12:35 04/02/19 ([email or web address]) Performed at [facility], [place] [address] [place], [place] [address]   SARS Coronavirus 2 [facility] order, Performed in [facility] hospital lab)     Status: None   Collection Time: 04/02/19 10:59 AM  Result Value Ref Range Status   SARS Coronavirus 2 NEGATIVE NEGATIVE Final    Comment: (NOTE) If result is NEGATIVE SARS-CoV-2 target nucleic acids are NOT DETECTED. The SARS-CoV-2 RNA is  generally detectable in upper and lower  respiratory specimens during the acute phase of infection. The lowest  concentration of SARS-CoV-2 viral copies this assay can detect is 250  copies / mL. A negative result does not preclude SARS-CoV-2 infection  and should not be used as the sole basis for treatment or other  patient management decisions.  A negative result may occur with  improper specimen collection / handling, submission of specimen other  than nasopharyngeal swab, presence of viral mutation(s) within the  areas targeted by this assay, and inadequate number of viral  copies  (<250 copies / mL). A negative result must be combined with clinical  observations, patient history, and epidemiological information. If result is POSITIVE SARS-CoV-2 target nucleic acids are DETECTED. The SARS-CoV-2 RNA is generally detectable in upper and lower  respiratory specimens dur ing the acute phase of infection.  Positive  results are indicative of active infection with SARS-CoV-2.  Clinical  correlation with patient history and other diagnostic information is  necessary to determine patient infection status.  Positive results do  not rule out bacterial infection or co-infection with other viruses. If result is PRESUMPTIVE POSTIVE SARS-CoV-2 nucleic acids MAY BE PRESENT.   A presumptive positive result was obtained on the submitted specimen  and confirmed on repeat testing.  While 2019 novel coronavirus  (SARS-CoV-2) nucleic acids may be present in the submitted sample  additional confirmatory testing may be necessary for epidemiological  and / or clinical management purposes  to differentiate between  SARS-CoV-2 and other Sarbecovirus currently known to infect humans.  If clinically indicated additional testing with an alternate test  methodology [phone]) is advised. The SARS-CoV-2 RNA is generally  detectable in upper and lower respiratory sp ecimens during the acute  phase of infection.  The expected result is Negative. Fact Sheet for Patients:  StrictlyIdeas.no Fact Sheet for Healthcare Providers: BankingDealers.co.za This test is not yet approved or cleared by the [place] FDA and has been authorized for detection and/or diagnosis of SARS-CoV-2 by FDA under an Emergency Use Authorization (EUA).  This EUA will remain in effect (meaning this test can be used) for the duration of the COVID-19 declaration under Section 564(b)(1) of the Act, 21 U.S.C. section 360bbb-3(b)(1), unless the authorization is terminated or revoked sooner. Performed at [facility], [place] [address], [place], [place] [address]   Culture, blood (routine x 2)     Status: None   Collection Time: 04/02/19 12:00 PM   Specimen: BLOOD RIGHT HAND  Result Value Ref Range Status   Specimen Description BLOOD RIGHT HAND  Final   Special Requests   Final    BOTTLES DRAWN AEROBIC ONLY Blood Culture adequate volume   Culture   Final    NO GROWTH 5 DAYS Performed at [facility], [place] [address], [place], [place] [address]    Report Status 04/07/2019 FINAL  Final  Culture, blood (routine x 2)     Status: None   Collection Time: 04/02/19 12:20 PM   Specimen: BLOOD  Result Value Ref Range Status   Specimen Description BLOOD RIGHT ANTECUBITAL  Final   Special Requests   Final    BOTTLES DRAWN AEROBIC ONLY Blood Culture adequate volume   Culture   Final    NO GROWTH 5 DAYS Performed at [facility], [place] [address], [place], [place] [address]    Report Status 04/07/2019 FINAL  Final  Urine culture     Status: None   Collection Time: 04/02/19  5:00 PM   Specimen: Urine, Catheterized  Result Value Ref Range Status   Specimen Description URINE, CATHETERIZED  Final   Special Requests NONE  Final   Culture   Final    NO GROWTH Performed at [facility], [place] [address] [place],  [address]    Report Status 04/03/2019 FINAL   Final  SARS Coronavirus 2 (CEPHEID - Performed in [facility] hospital lab), Hosp Order     Status: None   Collection Time: 04/08/19  1:23 AM   Specimen:  Nasopharyngeal Swab  Result Value Ref Range Status   SARS Coronavirus 2 NEGATIVE NEGATIVE Final    Comment: (NOTE) If result is NEGATIVE SARS-CoV-2 target nucleic acids are NOT DETECTED. The SARS-CoV-2 RNA is generally detectable in upper and lower  respiratory specimens during the acute phase of infection. The lowest  concentration of SARS-CoV-2 viral copies this assay can detect is 250  copies / mL. A negative result does not preclude SARS-CoV-2 infection  and should not be used as the sole basis for treatment or other  patient management decisions.  A negative result may occur with  improper specimen collection / handling, submission of specimen other  than nasopharyngeal swab, presence of viral mutation(s) within the  areas targeted by this assay, and inadequate number of viral copies  (<250 copies / mL). A negative result must be combined with clinical  observations, patient history, and epidemiological information. If result is POSITIVE SARS-CoV-2 target nucleic acids are DETECTED. The SARS-CoV-2 RNA is generally detectable in upper and lower  respiratory specimens dur ing the acute phase of infection.  Positive  results are indicative of active infection with SARS-CoV-2.  Clinical  correlation with patient history and other diagnostic information is  necessary to determine patient infection status.  Positive results do  not rule out bacterial infection or co-infection with other viruses. If result is PRESUMPTIVE POSTIVE SARS-CoV-2 nucleic acids MAY BE PRESENT.   A presumptive positive result was obtained on the submitted specimen  and confirmed on repeat testing.  While 2019 novel coronavirus  (SARS-CoV-2) nucleic acids may be present in the submitted sample  additional confirmatory testing may be necessary for  epidemiological  and / or clinical management purposes  to differentiate between  SARS-CoV-2 and other Sarbecovirus currently known to infect humans.  If clinically indicated additional testing with an alternate test  methodology [phone]) is advised. The SARS-CoV-2 RNA is generally  detectable in upper and lower respiratory sp ecimens during the acute  phase of infection. The expected result is Negative. Fact Sheet for Patients:  StrictlyIdeas.no Fact Sheet for Healthcare Providers: BankingDealers.co.za This test is not yet approved or cleared by the [place] FDA and has been authorized for detection and/or diagnosis of SARS-CoV-2 by FDA under an Emergency Use Authorization (EUA).  This EUA will remain in effect (meaning this test can be used) for the duration of the COVID-19 declaration under Section 564(b)(1) of the Act, 21 U.S.C. section 360bbb-3(b)(1), unless the authorization is terminated or revoked sooner. Performed at [facility], [place] [address] [place], [place] [address]     Radiology Studies: Dg Chest [place] 1 View  Result Date: 04/10/2019 CLINICAL DATA:  72 year old male with a history of shortness of breath EXAM: PORTABLE CHEST 1 VIEW COMPARISON:  April 08, 2019 FINDINGS: Cardiomediastinal silhouette unchanged in size and contour. No interlobular septal thickening. No pneumothorax. Similar appearance of right basilar opacity with obscuration of the right hemidiaphragm and the right heart border. Pleuroparenchymal thickening along the periphery. Linear opacity at the left base persists. No displaced fracture. IMPRESSION: Unchanged right basilar opacity likely a combination of atelectasis/consolidation and pleural effusion. Electronically Signed   By: Corrie Mckusick D.O.   On: 04/10/2019 08:50   Scheduled Meds: . apixaban  5 mg Oral BID  . arformoterol  15 mcg Nebulization BID  . budesonide (PULMICORT) nebulizer solution   0.25 mg Nebulization BID  . carvedilol  12.5 mg Oral BID WC  . diltiazem  120 mg Oral Daily  .  fluticasone  2 spray Each Nare Daily  . folic acid  1 mg Oral Daily  . furosemide  40 mg Intravenous BID  . gabapentin  400 mg Oral BID  . guaiFENesin  1,200 mg Oral BID  . insulin aspart  0-15 Units Subcutaneous TID WC  . insulin aspart  0-5 Units Subcutaneous QHS  . ipratropium  0.5 mg Nebulization TID  . levalbuterol  0.63 mg Nebulization TID  . loratadine  10 mg Oral Daily  . mesalamine  2.4 g Oral Q breakfast  . methylPREDNISolone (SOLU-MEDROL) injection  60 mg Intravenous Q8H  . multivitamin with minerals  1 tablet Oral Daily  . pantoprazole  40 mg Oral Daily  . sodium chloride flush  3 mL Intravenous Q12H  . thiamine  100 mg Oral Daily   Continuous Infusions: . sodium chloride    . azithromycin 500 mg (04/10/19 0645)    LOS: 2 days   Eddie Elbe, DO [facility] PAGER is on [place]  If 7PM-7AM, please contact night-coverage [email or web address] Password [facility] 04/10/2019, 2:47 PM

## 2019-04-10 NOTE — Discharge Instructions (Signed)
Information on my medicine - ELIQUIS (apixaban)  This medication education was reviewed with me or my healthcare representative as part of my discharge preparation.  The pharmacist that spoke with me during my hospital stay was:   Why was Eliquis prescribed for you? Eliquis was prescribed for you to reduce the risk of forming blood clots that can cause a stroke if you have a medical condition called atrial fibrillation (a type of irregular heartbeat) OR to reduce the risk of a blood clots forming after orthopedic surgery.  What do You need to know about Eliquis ? Take your Eliquis TWICE DAILY - one tablet in the morning and one tablet in the evening with or without food.  It would be best to take the doses about the same time each day.  If you have difficulty swallowing the tablet whole please discuss with your pharmacist how to take the medication safely.  Take Eliquis exactly as prescribed by your doctor and DO NOT stop taking Eliquis without talking to the doctor who prescribed the medication.  Stopping may increase your risk of developing a new clot or stroke.  Refill your prescription before you run out.  After discharge, you should have regular check-up appointments with your healthcare provider that is prescribing your Eliquis.  In the future your dose may need to be changed if your kidney function or weight changes by a significant amount or as you get older.  What do you do if you miss a dose? If you miss a dose, take it as soon as you remember on the same day and resume taking twice daily.  Do not take more than one dose of ELIQUIS at the same time.  Important Safety Information A possible side effect of Eliquis is bleeding. You should call your healthcare provider right away if you experience any of the following: ? Bleeding from an injury or your nose that does not stop. ? Unusual colored urine (red or dark brown) or unusual colored stools (red or black). ? Unusual  bruising for unknown reasons. ? A serious fall or if you hit your head (even if there is no bleeding).  Some medicines may interact with Eliquis and might increase your risk of bleeding or clotting while on Eliquis. To help avoid this, consult your healthcare provider or pharmacist prior to using any new prescription or non-prescription medications, including herbals, vitamins, non-steroidal anti-inflammatory drugs (NSAIDs) and supplements.  This website has more information on Eliquis (apixaban): www.DubaiSkin.no.

## 2019-04-10 NOTE — Consult Note (Signed)
Consultation Note Date: 04/10/2019   Patient Name: Eddie Rivera  DOB: [birth year]  MRN: [id number]  Age / Sex: 73 y.o., male  PCP: Patient, No Pcp Per Referring Physician: Kerney Elbe, DO  Reason for Consultation: Establishing goals of care and Psychosocial/spiritual support, frequent hospitalizations  HPI/Patient Profile: 73 y.o. male, Eddie Rivera, with past medical history of lung cancer, COPD on 2-4L O2 at home, Crohns disease, afib with RVR who was admitted on 04/07/2019 with shortness of breath.  Apparently he had recently been to the [facility].  The pulmonary physician there had warned him against taking ambien as he is a CO2 retainer.  She wanted to keep him there on BiPAP.  He refused and left.   He presented to [facility] ER with acute respiratory failure, hypercapneic secondary to a COPD exacerbation.  Clinical Assessment and Goals of Care:  I have reviewed medical records including EPIC notes, labs and imaging, received report from the care team, assessed the patient and spoke with him  to discuss diagnosis prognosis, GOC, EOL wishes, disposition and options.  I introduced Palliative Medicine as specialized medical care for people living with serious illness. It focuses on providing relief from the symptoms and stress of a serious illness. The goal is to improve quality of life for both the patient and the family.  We discussed a brief life review of the patient.  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 He has 1 daughter Animal nutritionist and a sister he is close too, [name].    Eddie Rivera has cared for his mother (in their home) for the last 28 years.  She died 67 month ago at age 9.  Eddie Rivera is suffering with grief.  In the last month he had an incident where he became hypercarbic after taking ambien.  He required  intubation.  During is intubation he felt he saw hell.  Afterward he made the effort to be saved.    Eddie Rivera and I discussed his Goals of Care.  He has a living will.  His HCPOA is his sister Rochele Pages, [phone].  Eddie Rivera tells me that "You've got to be able to stop and smell the roses or you may as well be dead!"  He described wanting to be able to be out of bed and functional - otherwise it is not an acceptable quality of life.  At this point Eddie Rivera would want resuscitation ("Try once and if it doesn't work then stop") but he would not want longer term life support.  He does not want a tracheostomy or PEG tube ever.  I asked Eddie Rivera if his sister understands his wishes and he says she does.   I talked to Eddie Rivera about what he is going to do now that his mother has passed.  He says he bought tricycle / motorcycle and hopes to be out riding it.  He loves his daughter dearly and hopes to spend time  with her.    He plans to return to his own home and live alone.  Questions and concerns were addressed. The patient was encouraged to call with questions or concerns.    Primary Decision Maker:  PATIENT    SUMMARY OF RECOMMENDATIONS    Full code for now.  Living will previously completed.    Sister Rochele Pages is his HCPOA, but he is comfortable with his daughter making decisions as well.  No long term life support.  No Tracheostomy.  No PEG  He is eligible for grief counseling thru [facility] (his mother was cared for there).  This may be helpful for him if he is agreeable.  He is also eligible for Palliative support thru [facility] (which is now [facility]). They would be a great resource for him outpatient.  Code Status/Advance Care Planning:  Full   Symptom Management:   I'm concerned that even a small amount of morphine used at home / unsupervised could make him hypercarbic.   If he was discharging to a supervised environment I would recommend low  dose sublingual morphine for SOB.  We can trial low dose use here in the hospital to see if it helps his chest tightness and SOB.  Additional Recommendations (Limitations, Scope, Preferences):  No Artificial Feeding and No Tracheostomy  Psycho-social/Spiritual:   Desire for further Chaplaincy support: yes  Prognosis:  Unable to determine.  He is a chronic CO2 retainer and is at high risk for decompensation particularly if he is not CPAP compliant.  Discharge Planning:  To be determined.      Primary Diagnoses: Present on Admission: . COPD exacerbation ([place]) . Atrial fibrillation with RVR ([place]) . Crohn's colitis ([place]) . OSA (obstructive sleep apnea)   I have reviewed the medical record, interviewed the patient and family, and examined the patient. The following aspects are pertinent.  Past Medical History:  Diagnosis Date  . Atrial fibrillation ([place])   . COPD (chronic obstructive pulmonary disease) ([place])   . Crohn's colitis ([place])   . Diabetes mellitus without complication ([place])   . Hypertension   . Lung cancer ([place])    LLL  . OSA (obstructive sleep apnea)    Social History   Socioeconomic History  . Marital status: Divorced    Spouse name: Not on file  . Number of children: Not on file  . Years of education: Not on file  . Highest education level: Not on file  Occupational History  . Not on file  Social Needs  . Financial resource strain: Not on file  . Food insecurity    Worry: Not on file    Inability: Not on file  . Transportation needs    Medical: Not on file    Non-medical: Not on file  Tobacco Use  . Smoking status: Former Smoker    Types: Cigarettes  . Smokeless tobacco: Never Used  Substance and Sexual Activity  . Alcohol use: Not Currently  . Drug use: Never  . Sexual activity: Not on file  Lifestyle  . Physical activity    Days per week: Not on file    Minutes per session: Not on file  . Stress: Not on file  Relationships  . Social  Herbalist on phone: Not on file    Gets together: Not on file    Attends religious service: Not on file    Active member of club or organization: Not on file  Attends meetings of clubs or organizations: Not on file    Relationship status: Not on file  Other Topics Concern  . Not on file  Social History Narrative  . Not on file   Family History  Problem Relation Age of Onset  . Dementia Mother   . Cervical cancer Mother   . Liver cancer Father    Scheduled Meds: . apixaban  5 mg Oral BID  . arformoterol  15 mcg Nebulization BID  . budesonide (PULMICORT) nebulizer solution  0.25 mg Nebulization BID  . carvedilol  12.5 mg Oral BID WC  . diltiazem  120 mg Oral Daily  . fluticasone  2 spray Each Nare Daily  . folic acid  1 mg Oral Daily  . furosemide  40 mg Intravenous BID  . gabapentin  400 mg Oral BID  . guaiFENesin  1,200 mg Oral BID  . insulin aspart  0-15 Units Subcutaneous TID WC  . insulin aspart  0-5 Units Subcutaneous QHS  . ipratropium  0.5 mg Nebulization TID  . levalbuterol  0.63 mg Nebulization TID  . loratadine  10 mg Oral Daily  . mesalamine  2.4 g Oral Q breakfast  . methylPREDNISolone (SOLU-MEDROL) injection  60 mg Intravenous Q8H  . multivitamin with minerals  1 tablet Oral Daily  . pantoprazole  40 mg Oral Daily  . sodium chloride flush  3 mL Intravenous Q12H  . thiamine  100 mg Oral Daily   Continuous Infusions: . sodium chloride    . azithromycin 500 mg (04/10/19 0645)  . cefTRIAXone (ROCEPHIN)  IV     PRN Meds:.sodium chloride, acetaminophen **OR** acetaminophen, albuterol, alum & mag hydroxide-simeth, loperamide, LORazepam, ondansetron (ZOFRAN) IV, sodium chloride flush Allergies  Allergen Reactions  . Atorvastatin Other (See Comments)    Patient doesn't recall the reaction (??)   Review of Systems SOB, chest tightness, anxiety, diarrhea, hemorrhoids  Physical Exam  Well developed, obese gentleman who quickly pops up to edge of bed  when I call his name. Resp  On N/C no distress.  Tip of his nose is discolored as though he is chronically hypoxic Abdomen Obese, soft, nt, nd Awake, Alert, coherent, pleasant, quick to become emotional when discussing his mother -  Fresh grief.   Vital Signs: BP 96/72 (BP Location: Left Arm)   Pulse 88   Temp 97.9 F (36.6 C) (Oral)   Resp 20   Ht 6' (1.829 m)   Wt 121.2 kg   SpO2 98%   BMI 36.24 kg/m  Pain Scale: 0-10   Pain Score: 0-No pain   SpO2: SpO2: 98 % O2 Device:SpO2: 98 % O2 Flow Rate: .O2 Flow Rate (L/min): 4 L/min  IO: Intake/output summary:   Intake/Output Summary (Last 24 hours) at 04/10/2019 1540 Last data filed at 04/10/2019 1100 Gross per 24 hour  Intake 1778.45 ml  Output 3350 ml  Net -1571.55 ml    LBM: Last BM Date: 04/10/19 Baseline Weight: Weight: 120.8 kg Most recent weight: Weight: 121.2 kg     Palliative Assessment/Data: 40%     Time In: 3:00 Time Out: 4:10 Time Total: 70 min. Visit consisted of counseling and education dealing with the complex and emotionally intense issues surrounding the need for palliative care and symptom management in the setting of serious and potentially life-threatening illness. Greater than 50%  of this time was spent counseling and coordinating care related to the above assessment and plan.  Signed by: Florentina Jenny, PA-C Palliative Medicine Pager: [phone]  Please contact Palliative Medicine Team phone at [phone] for questions and concerns.  For individual provider: See Shea Evans

## 2019-04-10 NOTE — Progress Notes (Signed)
ANTICOAGULATION CONSULT NOTE - Follow Up Consult  Pharmacy Consult for Xarelto Indication: atrial fibrillation  Allergies  Allergen Reactions  . Atorvastatin Other (See Comments)    Patient doesn't recall the reaction (??)    Patient Measurements: Height: 6' (182.9 cm) Weight: 267 lb 3.2 oz (121.2 kg) IBW/kg (Calculated) : 77.6  Vital Signs: Temp: 97.9 F (36.6 C) (07/31 1437) Temp Source: Oral (07/31 1437) BP: 96/72 (07/31 1437) Pulse Rate: 88 (07/31 1437)  Labs: Recent Labs    04/08/19 0006  04/08/19 0410 04/08/19 0720 04/09/19 0350 04/10/19 0350  HGB 13.2   < > 13.5  --  12.5* 12.6*  HCT 46.7   < > 47.8  --  42.4 41.8  PLT 220  --  235  --  248 275  CREATININE 0.84  --  0.93  --  0.90 0.96  TROPONINIHS 7  --  8 7  --   --    < > = values in this interval not displayed.    Estimated Creatinine Clearance: 94.8 mL/min (by C-G formula based on SCr of 0.96 mg/dL).  Assessment: 73 year old male with atrial fibrillation. ASA 325mg  and no anticoagulation PTA, patient stated taking an anticoagulant prior but refused to continue due to bruising. Received lovenox 40 mg SQ for VTE prophylaxis 7/30. Patient received 2 doses of Eliquis starting 7/30. MD consulted pharmacy to switch Eliquis to Xarelto.  Goal of Therapy:  Monitor platelets by anticoagulation protocol: Yes   Plan:  Discontinue Eliquis Start Xarelto 20 mg PO daily Monitor s/sx's of bleed  Soloman Mckeithan L. Devin Going, PharmD, [place] PGY1 Pharmacy Resident [employer]: [phone]  Mobile: [phone]  04/10/19 4:21 PM  Please check AMION for all [place] phone numbers After 10:00 PM, call the [place] [phone]

## 2019-04-11 ENCOUNTER — Inpatient Hospital Stay (HOSPITAL_COMMUNITY): Payer: No Typology Code available for payment source

## 2019-04-11 LAB — CBC WITH DIFFERENTIAL/PLATELET
Abs Immature Granulocytes: 0.1 10*3/uL — ABNORMAL HIGH (ref 0.00–0.07)
Basophils Absolute: 0 10*3/uL (ref 0.0–0.1)
Basophils Relative: 0 %
Eosinophils Absolute: 0 10*3/uL (ref 0.0–0.5)
Eosinophils Relative: 0 %
HCT: 47.2 % (ref 39.0–52.0)
Hemoglobin: 13.9 g/dL (ref 13.0–17.0)
Immature Granulocytes: 1 %
Lymphocytes Relative: 8 %
Lymphs Abs: 0.9 10*3/uL (ref 0.7–4.0)
MCH: 28.9 pg (ref 26.0–34.0)
MCHC: 29.4 g/dL — ABNORMAL LOW (ref 30.0–36.0)
MCV: 98.1 fL (ref 80.0–100.0)
Monocytes Absolute: 0.4 10*3/uL (ref 0.1–1.0)
Monocytes Relative: 3 %
Neutro Abs: 10.2 10*3/uL — ABNORMAL HIGH (ref 1.7–7.7)
Neutrophils Relative %: 88 %
Platelets: 329 10*3/uL (ref 150–400)
RBC: 4.81 MIL/uL (ref 4.22–5.81)
RDW: 14.6 % (ref 11.5–15.5)
WBC: 11.5 10*3/uL — ABNORMAL HIGH (ref 4.0–10.5)
nRBC: 0 % (ref 0.0–0.2)

## 2019-04-11 LAB — COMPREHENSIVE METABOLIC PANEL
ALT: 28 U/L (ref 0–44)
AST: 18 U/L (ref 15–41)
Albumin: 3.4 g/dL — ABNORMAL LOW (ref 3.5–5.0)
Alkaline Phosphatase: 64 U/L (ref 38–126)
Anion gap: 10 (ref 5–15)
BUN: 27 mg/dL — ABNORMAL HIGH (ref 8–23)
CO2: 41 mmol/L — ABNORMAL HIGH (ref 22–32)
Calcium: 8.9 mg/dL (ref 8.9–10.3)
Chloride: 88 mmol/L — ABNORMAL LOW (ref 98–111)
Creatinine, Ser: 1 mg/dL (ref 0.61–1.24)
GFR calc Af Amer: >60 mL/min (ref >60)
GFR calc non Af Amer: >60 mL/min (ref >60)
Glucose, Bld: 183 mg/dL — ABNORMAL HIGH (ref 70–99)
Potassium: 4.2 mmol/L (ref 3.5–5.1)
Sodium: 139 mmol/L (ref 135–145)
Total Bilirubin: 0.8 mg/dL (ref 0.3–1.2)
Total Protein: 6.7 g/dL (ref 6.5–8.1)

## 2019-04-11 LAB — GLUCOSE, CAPILLARY
Glucose-Capillary: 183 mg/dL — ABNORMAL HIGH (ref 70–99)
Glucose-Capillary: 195 mg/dL — ABNORMAL HIGH (ref 70–99)

## 2019-04-11 LAB — MAGNESIUM: Magnesium: 2.4 mg/dL (ref 1.7–2.4)

## 2019-04-11 LAB — TSH: TSH: 0.556 u[IU]/mL (ref 0.350–4.500)

## 2019-04-11 LAB — PHOSPHORUS: Phosphorus: 3.8 mg/dL (ref 2.5–4.6)

## 2019-04-11 MED ORDER — FUROSEMIDE 10 MG/ML IJ SOLN
60.0000 mg | Freq: Two times a day (BID) | INTRAMUSCULAR | Status: DC
  Administered 2019-04-11 – 2019-04-12 (×2): 60 mg via INTRAVENOUS
  Filled 2019-04-11 (×2): qty 6

## 2019-04-11 NOTE — Progress Notes (Signed)
Patient refused CPAP.

## 2019-04-11 NOTE — Progress Notes (Signed)
PROGRESS NOTE    Eddie Rivera  HWE:[id number] DOB: Jun 23, 1946 DOA: 04/07/2019 PCP: Patient, No Pcp Per   Brief Narrative:  The patient is a 73 year old Caucasian [place] veteran with a past medical history significant for but not limited to Crohn's colitis, chronic hypoxic respiratory failure on 2 to 4 L of oxygen via nasal cannula history of significant COPD, OSA, history of prior lung cancer, former tobacco abuse smoker, history of atrial fibrillation unclear if he is on anticoagulation as well as other comorbidities who presents with a chief complaint of "blood pressure being off as well as breathing being off."  States that he is progressively gotten worsening dyspnea over the last week and states that his blood pressures been off.  States that he was unable to breathe so presented to the ED for further evaluation.  He was worked up and admitted for a COPD exacerbation was also found to be in A. fib with RVR and started on a Cardizem drip.  Patient's respiratory status is slowly improving and he continues remain volume overloaded so we will have Cardiology evaluate.  Because he had been frequently hospitalized will obtain a palliative care consultation for goals of care discussion.  Of note the patient has been noncompliant with fluid restriction and has been drinking water if he wants.  Discussed with him about fluid restriction and I have increased his diuresis today.  Palliative care evaluated and living will was previously completed.  Patient does not want any long-term life support or tracheostomy or PEG tube.  He remains full code and palliative is recommended grief counseling through the [facility] if he is agreeable as well as palliative support through [facility].  Assessment & Plan:   Principal Problem:   COPD exacerbation ([place]) Active Problems:   Crohn's colitis ([place])   Atrial fibrillation with RVR ([facility])   OSA (obstructive sleep apnea)   Essential  hypertension   Palliative care encounter   Acute Respiratory Failure on Chronic Respiratory Failure with Hypoxia, and Hypercapnea Secondary to COPD Exacerbation and ? HFpEF CHF from A Fib along with OSA and ? Right Lower PNA, poA -Admitted to SDU as an inpatient -ABG done on 04/08/2019 showed a pH of 7.305/PCO2 of 78.1/PO2 of 233.0/bicarbonate level of 38.9/O2 saturation of 100% on BiPAP -Weaned off of BiPAP and now back on 4 L nasal cannula -Continuous pulse oximetry and maintain O2 saturations greater than 90% -Continue supplemental oxygen via nasal cannula and wean to home dose -Placed on Solumedrol 80mg  iv q8h and have reduced dose to 60 mg IV q. 8 scheduled and was reduced to 60 mg IV q12h and will continue it for today and change to 60 mg Daily tomorrow  -C/w Azithromycin 500mg  iv qday for least 5 days; started IV ceftriaxone given questionable pneumonia may need to escalate given that the patient has been hospitalized in last 90 days; since WBC is elevated likely in the setting of IV steroid demargination  -Stop Spiriva 1puff qday and started the patient on Xopenex/ipratropium nebs every 6 scheduled -Dulera substitution to stop and start the patient on budesonide as well as Brovana -Albuterol neb q6h stopped due to Xopenex and q6h prn  -Started guaifenesin 1200 mg p.o. twice daily -Continue with flutter valve, incentive spirometry -Started twice daily dosing of IV Lasix 40 mg but since patient is not fluid compliant we will increase the dose to 60 mg twice daily -We will check strict I's and O's, daily weights; Patient is -  4.254 liters since admission; weight is down 2 pounds since admission -BNP was 110.5 -Continue supplemental oxygen via nasal cannula and wean O2 as tolerated to home dose -CXR on Admission showed "Right lower lobe opacity, likely reflecting a combination of atelectasis and right pleural effusion, mildly progressive." -Repeat chest x-ray this AM showed "Persistent  small right pleural effusion and underlying consolidation which may represent atelectasis or infection.." -We will place the patient on CPAP at night; may need pulmonary evaluation if not improving significantly -Cardiology is consulted for further evaluation recommendations appreciate assistance -will obtain SLP given his right lower lobe consolidation which raises the question for aspiration -We will also obtain a Palliative Consultation given patient's multiple admissions the last few weeks  Afib with RVR  -Has an elevated CHA2DS2-VASc of 5 -C/w Telemetry Monitoring  -Trop I q3h x2 flat and negative -Checked TSH and was 0.556 -Check Cardiac ECHOCardiogram and showed left ventricule systolic function with an EF of 60 to 65% left ventricular diastolic function could not be evaluated due to A. fib.  There is no increase in right ventricle wall thickness and right ventricular systolic pressure was mildly elevated.  There is musculatures of the aortic valve and aortic valve regurgitation was not assessed by color Doppler flow inferior vena cava was dilated in size with greater than 50% respiratory variability -Discussed the case with cardiology Dr. Randa Lynn who recommends discontinuing aspirin 325 mg po Daily and starting the patient on apixaban 5 mg p.o. twice daily -Unclear why not on anticoagulation and will be obtaining records from the [facility] but could not find reasons are started on her -Cont Carvedilol 12.5mg  po bid -Cardizem gtt  Now stopped and transitioned to po 120 mg Cardizem CD; May need to Titrate up -Will need Cardiology Evaluation and further recommendations about Anticoagulation and have consulted Dr. Virgina Jock for further evaluation; Dr. Virgina Jock recommended initially apixaban but after discussing with the patient patient did not want apixaban due to bruising so we will start the patient on Xarelto 20 mg daily per cardiology recommendations -Appreciate further cardiology  evaluation and assistance  Volume Overload with Acute Exacerbation of Chronic Diastolic Heart Failure with Preserved EF -We will check a Cardiac Echocardiogram and systolic function was normal but diastolic function could not be assessed due to A. fib -Checked BNP and was 110. -Strict I's and O's and daily weights; Fluid restrict to 1500 mL's -Given Diuresis with Lasix 40 mg and changed to IV BID yesterday and will increase to IV 60 today -We will have cardiology weigh in for formal evaluation -Patient is - 4.254 liters; weight is down 2 pounds -Continue to monitor for signs of volume overload -Appreciate cardiology evaluation assistance and cardiology recommending sending the patient home on torsemide 20 mg p.o. twice daily once he is stable for discharge  Crohns Colitis -Cont Mesalamine 0.4 g daily with breakfast  GERD -Cont PPI Omeprazole Substitution with Pantoprazole  Hyperglycemia in the setting of Diabetes Mellitus Type 2 -Hemoglobin A1c was 7.4 -Blood sugars are likely worsened in the setting of IV steroid demargination -Sugars have been ranging from 160-191 -Placed on a sensitive NovoLog sliding scale and have now increases to moderate NovoLog sliding scale before meals and at bedtime  Obesity -Estimated body mass index is 35.82 kg/m as calculated from the following:   Height as of this encounter: 6' (1.829 m).   Weight as of this encounter: 119.8 kg. -Weight Loss and Dietary Counseling Given  Metabolic Alkalosis -Likely in the setting of  diuresis and COPD -Chloride was 89 and CO2 was 37 -We will need to continue to monitor carefully -Continue monitor and trend and repeat CMP in a.m.  Leukocytosis, slightly improving -Patient's WBC went from 12.7 -> 13.0 -> 14.1 -> 11.5 -In the setting of IV steroid demargination and reactive from acute exacerbation of COPD  -Continue monitor for signs and symptoms of infection -Continue with IV azithromycin and addition of IV  ceftriaxone for suspected right lower lobe consolidation -Repeat CBC in a.m.  Macrocytic/Normocytic Anemia -Patient's hemoglobin/hematocrit went from 13.5/47.8 -> 12.6/41.8 -> 13.9/47.2 -Check Anemia panel in the a.m. -We will monitor for signs and symptoms of bleeding; currently no overt bleeding noted -Repeat CBC in AM   History of Alcohol Abuse, stable -Placed on CIWA protocol and will need to continue to Monitor Closely however after further discussion he no longer drinks and last drink was about 5 years ago -Continue with folic acid, multivitamin as well as thiamine -We will discontinue CIWA protocol now  DVT prophylaxis: Anticoagulated with Rivaroxaban Code Status: FULL CODE Family Communication: No family present at bedside  Disposition Plan: Pending further work-up and improvement in clinical status as well as evaluation by palliative care medicine and Cardiology  Consultants:   Palliative Care Medicine   Cardiology Dr. Virgina Jock    Procedures:  ECHOCARDIOGRAM IMPRESSIONS    1. The left ventricle has normal systolic function with an ejection fraction of 60-65%. The cavity size was mild to moderately dilated. There is mildly increased left ventricular wall thickness. Left ventricular diastolic function could not be evaluated  secondary to atrial fibrillation. Elevated left ventricular end-diastolic pressure.  2. The right ventricle was not well visualized. The cavity was normal. There is no increase in right ventricular wall thickness. Right ventricular systolic pressure is mildly elevated.  3. Left atrial size was mildly dilated.  4. The aortic valve is tricuspid. Mild sclerosis of the aortic valve. Aortic valve regurgitation was not assessed by color flow Doppler.  5. The aorta is normal in size and structure.  6. The inferior vena cava was dilated in size with >50% respiratory variability.  FINDINGS  Left Ventricle: The left ventricle has normal systolic  function, with an ejection fraction of 60-65%. The cavity size was mild to moderately dilated. There is mildly increased left ventricular wall thickness. Left ventricular diastolic function could  not be evaluated secondary to atrial fibrillation. Elevated left ventricular end-diastolic pressure  Right Ventricle: The right ventricle was not well visualized. The cavity was normal. There is no increase in right ventricular wall thickness. Right ventricular systolic pressure is mildly elevated.  Left Atrium: Left atrial size was mildly dilated.  Right Atrium: Right atrial size was normal in size.  Interatrial Septum: No atrial level shunt detected by color flow Doppler.  Pericardium: There is no evidence of pericardial effusion.  Mitral Valve: The mitral valve is normal in structure. Mitral valve regurgitation was not assessed by color flow Doppler.  Tricuspid Valve: The tricuspid valve is normal in structure. Tricuspid valve regurgitation is mild by color flow Doppler.  Aortic Valve: The aortic valve is tricuspid Mild sclerosis of the aortic valve. Aortic valve regurgitation was not assessed by color flow Doppler.  Pulmonic Valve: The pulmonic valve was normal in structure. Pulmonic valve regurgitation is not visualized by color flow Doppler.  Aorta: The aorta is normal in size and structure.  Venous: The inferior vena cava measures 2.43 cm, is dilated in size with greater than 50% respiratory variability.    +--------------+--------++  LEFT VENTRICLE         +----------------+----------++ +--------------+--------++ Diastology                 PLAX 2D                +----------------+----------++ +--------------+--------++ LV e' lateral:  12.40 cm/s LVIDd:        6.10 cm  +----------------+----------++ +--------------+--------++ LV E/e' lateral:9.0        LVIDs:        5.20 cm  +----------------+----------++ +--------------+--------++ LV e'  medial:   6.09 cm/s  LV PW:        1.20 cm  +----------------+----------++ +--------------+--------++ LV E/e' medial: 18.2       LV IVS:       1.10 cm  +----------------+----------++ +--------------+--------++ LVOT diam:    2.20 cm  +--------------+--------++ LV SV:        57 ml    +--------------+--------++ LV SV Index:  23.02    +--------------+--------++ LVOT Area:    3.80 cm +--------------+--------++                        +--------------+--------++  +---------------+---------++ RIGHT VENTRICLE          +---------------+---------++ RV Basal diam: 3.20 cm   +---------------+---------++ RV S prime:    7.40 cm/s +---------------+---------++ TAPSE (M-mode):2.9 cm    +---------------+---------++ RVSP:          37.3 mmHg +---------------+---------++  +---------------+-------++-----------++ LEFT ATRIUM           Index       +---------------+-------++-----------++ LA diam:       4.20 cm1.76 cm/m  +---------------+-------++-----------++ LA Vol (A2C):  65.8 ml27.57 ml/m +---------------+-------++-----------++ LA Vol (A4C):  64.1 ml26.86 ml/m +---------------+-------++-----------++ LA Biplane Vol:65.1 ml27.28 ml/m +---------------+-------++-----------++ +------------+---------++-----------++ RIGHT ATRIUM         Index       +------------+---------++-----------++ RA Pressure:8.00 mmHg            +------------+---------++-----------++ RA Area:    15.60 cm            +------------+---------++-----------++ RA Volume:  35.80 ml 15.00 ml/m +------------+---------++-----------++  +------------+-----------++ AORTIC VALVE            +------------+-----------++ LVOT Vmax:  99.00 cm/s  +------------+-----------++ LVOT Vmean: 59.600 cm/s +------------+-----------++ LVOT VTI:   0.159 m     +------------+-----------++   +-------------+-------++  AORTA                +-------------+-------++ Ao Root diam:3.60 cm +-------------+-------++  +--------------+----------++  +---------------+-----------++ MITRAL VALVE              TRICUSPID VALVE            +--------------+----------++  +---------------+-----------++ MV Area (PHT):6.63 cm    TR Peak grad:  29.3 mmHg   +--------------+----------++  +---------------+-----------++ MV PHT:       33.20 msec  TR Vmax:       276.00 cm/s +--------------+----------++  +---------------+-----------++ MV Decel Time:115 msec    Estimated RAP: 8.00 mmHg   +--------------+----------++  +---------------+-----------++ +--------------+-----------++ RVSP:          37.3 mmHg   MV E velocity:111.00 cm/s +---------------+-----------++ +--------------+-----------++                               +--------------+-------+  SHUNTS                                              +--------------+-------+                               Systemic VTI: 0.16 m                                +--------------+-------+                               Systemic Diam:2.20 cm                               +--------------+-------+  +---------+-------+ IVC              +---------+-------+ IVC diam:2.43 cm +---------+-------+  Antimicrobials:  Anti-infectives (From admission, onward)   Start     Dose/Rate Route Frequency Ordered Stop   04/10/19 1500  cefTRIAXone (ROCEPHIN) 1 g in sodium chloride 0.9 % 100 mL IVPB     1 g 200 mL/hr over 30 Minutes Intravenous Every 24 hours 04/10/19 1449     04/08/19 0400  azithromycin (ZITHROMAX) 500 mg in sodium chloride 0.9 % 250 mL IVPB     500 mg 250 mL/hr over 60 Minutes Intravenous Every 24 hours 04/08/19 0332       Subjective: Seen and examined and states states that he still not able to tolerate CPAP at night.  States breathing has not changed significantly.  Nursing states that he has  been significantly noncompliant with his fluid restriction and is been drinking whatever he wants.  I discussed with him about this and advised him to stick to a fluid restricted diet to help better diurese him improve his respiratory symptoms.  He states that he still coughing up some sputum.  No nausea or vomiting.  No other concerns or complaints at this time.  Objective: Vitals:   04/11/19 0804 04/11/19 0810 04/11/19 0818 04/11/19 0900  BP:    108/65  Pulse:      Resp:      Temp:      TempSrc:      SpO2: 96% 96% 99%   Weight:      Height:        Intake/Output Summary (Last 24 hours) at 04/11/2019 1412 Last data filed at 04/11/2019 1300 Gross per 24 hour  Intake 977.26 ml  Output 3165 ml  Net -2187.74 ml   Filed Weights   04/09/19 0314 04/10/19 0524 04/11/19 0430  Weight: 120.8 kg 121.2 kg 119.8 kg   Examination: Physical Exam:  Constitutional: Well-nourished, well-developed obese Caucasian male currently no acute distress sitting in the bed appears calm but does still appear somewhat uncomfortable Eyes: Lids and conjunctive are normal.  Sclera anicteric ENMT: External ears and nose appear normal.  Grossly normal hearing.  Mucous members are moist Neck: Appears supple but JVD is very difficult to assess secondary body habitus Respiratory: Diminished auscultation bilaterally no appreciable wheezing, rales, rhonchi but does have some coarse breath sounds.  Has a normal respiratory effort is not tachypneic but he is wearing supplemental oxygen via  nasal cannula at 4 L Cardiovascular: Irregularly irregular but rate controlled.  Has no appreciable murmurs, rubs, gallops.  Has 1-2+ lower extremity pitting edema again Abdomen: Soft, non-trichomonacide second body habitus.  Backslash present GU: Deferred Musculoskeletal: No contractures or cyanosis.  No joint deformities in the upper and lower extremities Skin:.  Skin is warm and dry no appreciable rashes or lesions limited skin  evaluation. Neurologic: Cranial nerves II through XII grossly intact no appreciable focal deficits Psychiatric: He remains somewhat anxious and depressed.  Has a normal judgment and insight.  Patient is awake, alert, oriented x3  Data Reviewed: I have personally reviewed following labs and imaging studies  CBC: Recent Labs  Lab 04/08/19 0006  04/08/19 0246 04/08/19 0410 04/09/19 0350 04/10/19 0350 04/11/19 0708  WBC 12.9*  --   --  12.7* 13.0* 14.1* 11.5*  NEUTROABS 9.5*  --   --   --  11.6* 12.3* 10.2*  HGB 13.2   < > 15.3 13.5 12.5* 12.6* 13.9  HCT 46.7   < > 45.0 47.8 42.4 41.8 47.2  MCV 103.5*  --   --  103.5* 99.1 96.1 98.1  PLT 220  --   --  235 248 275 329   < > = values in this interval not displayed.   Basic Metabolic Panel: Recent Labs  Lab 04/08/19 0006  04/08/19 0246 04/08/19 0410 04/09/19 0350 04/10/19 0350 04/11/19 0708  NA 139   < > 136 140 139 136 139  K 5.0   < > 4.5 5.1 5.0 4.4 4.2  CL 97*  --   --  97* 91* 89* 88*  CO2 35*  --   --  35* 38* 37* 41*  GLUCOSE 139*  --   --  137* 185* 183* 183*  BUN 11  --   --  8 18 25* 27*  CREATININE 0.84  --   --  0.93 0.90 0.96 1.00  CALCIUM 8.8*  --   --  8.8* 9.0 8.8* 8.9  MG  --   --   --   --  2.0 2.2 2.4  PHOS  --   --   --   --  2.6 3.0 3.8   < > = values in this interval not displayed.   GFR: Estimated Creatinine Clearance: 90.6 mL/min (by C-G formula based on SCr of 1 mg/dL). Liver Function Tests: Recent Labs  Lab 04/08/19 0410 04/09/19 0350 04/10/19 0350 04/11/19 0708  AST 24 17 15 18   ALT 30 26 25 28   ALKPHOS 79 68 59 64  BILITOT 1.0 0.7 0.6 0.8  PROT 7.0 6.3* 6.2* 6.7  ALBUMIN 3.4* 3.1* 3.1* 3.4*   No results for input(s): LIPASE, AMYLASE in the last 168 hours. No results for input(s): AMMONIA in the last 168 hours. Coagulation Profile: No results for input(s): INR, PROTIME in the last 168 hours. Cardiac Enzymes: No results for input(s): CKTOTAL, CKMB, CKMBINDEX, TROPONINI in the last  168 hours. BNP (last 3 results) No results for input(s): PROBNP in the last 8760 hours. HbA1C: Recent Labs    04/09/19 0350  HGBA1C 7.4*   CBG: Recent Labs  Lab 04/10/19 0737 04/10/19 1137 04/10/19 1654 04/10/19 2140 04/11/19 1117  GLUCAP 191* 160* 173* 187* 183*   Lipid Profile: No results for input(s): CHOL, HDL, LDLCALC, TRIG, CHOLHDL, LDLDIRECT in the last 72 hours. Thyroid Function Tests: Recent Labs    04/11/19 0708  TSH 0.556   Anemia Panel: No results for input(s): VITAMINB12,  FOLATE, FERRITIN, TIBC, IRON, RETICCTPCT in the last 72 hours. Sepsis Labs: No results for input(s): PROCALCITON, LATICACIDVEN in the last 168 hours.  Recent Results (from the past 240 hour(s))  MRSA PCR Screening     Status: Abnormal   Collection Time: 04/02/19 10:57 AM   Specimen: Nasal Mucosa; Nasopharyngeal  Result Value Ref Range Status   MRSA by PCR POSITIVE (A) NEGATIVE Final    Comment:        The GeneXpert MRSA Assay (FDA approved for NASAL specimens only), is one component of a comprehensive MRSA colonization surveillance program. It is not intended to diagnose MRSA infection nor to guide or monitor treatment for MRSA infections. RESULT CALLED TO, READ BACK BY AND VERIFIED WITH: Cleon Dew RN 12:35 04/02/19 ([email or web address]) Performed at [facility], [place] [address], [place], [place] [address]   SARS Coronavirus 2 [facility] order, Performed in [facility] hospital lab)     Status: None   Collection Time: 04/02/19 10:59 AM  Result Value Ref Range Status   SARS Coronavirus 2 NEGATIVE NEGATIVE Final    Comment: (NOTE) If result is NEGATIVE SARS-CoV-2 target nucleic acids are NOT DETECTED. The SARS-CoV-2 RNA is generally detectable in upper and lower  respiratory specimens during the acute phase of infection. The lowest  concentration of SARS-CoV-2 viral copies this assay can detect is 250  copies / mL. A negative result does not preclude SARS-CoV-2 infection  and should  not be used as the sole basis for treatment or other  patient management decisions.  A negative result may occur with  improper specimen collection / handling, submission of specimen other  than nasopharyngeal swab, presence of viral mutation(s) within the  areas targeted by this assay, and inadequate number of viral copies  (<250 copies / mL). A negative result must be combined with clinical  observations, patient history, and epidemiological information. If result is POSITIVE SARS-CoV-2 target nucleic acids are DETECTED. The SARS-CoV-2 RNA is generally detectable in upper and lower  respiratory specimens dur ing the acute phase of infection.  Positive  results are indicative of active infection with SARS-CoV-2.  Clinical  correlation with patient history and other diagnostic information is  necessary to determine patient infection status.  Positive results do  not rule out bacterial infection or co-infection with other viruses. If result is PRESUMPTIVE POSTIVE SARS-CoV-2 nucleic acids MAY BE PRESENT.   A presumptive positive result was obtained on the submitted specimen  and confirmed on repeat testing.  While 2019 novel coronavirus  (SARS-CoV-2) nucleic acids may be present in the submitted sample  additional confirmatory testing may be necessary for epidemiological  and / or clinical management purposes  to differentiate between  SARS-CoV-2 and other Sarbecovirus currently known to infect humans.  If clinically indicated additional testing with an alternate test  methodology [phone]) is advised. The SARS-CoV-2 RNA is generally  detectable in upper and lower respiratory sp ecimens during the acute  phase of infection. The expected result is Negative. Fact Sheet for Patients:  StrictlyIdeas.no Fact Sheet for Healthcare Providers: BankingDealers.co.za This test is not yet approved or cleared by the [place] FDA and has been  authorized for detection and/or diagnosis of SARS-CoV-2 by FDA under an Emergency Use Authorization (EUA).  This EUA will remain in effect (meaning this test can be used) for the duration of the COVID-19 declaration under Section 564(b)(1) of the Act, 21 U.S.C. section 360bbb-3(b)(1), unless the authorization is terminated or revoked sooner. Performed at [facility]  [facility], [place] [address], [place], [place] [address]   Culture, blood (routine x 2)     Status: None   Collection Time: 04/02/19 12:00 PM   Specimen: BLOOD RIGHT HAND  Result Value Ref Range Status   Specimen Description BLOOD RIGHT HAND  Final   Special Requests   Final    BOTTLES DRAWN AEROBIC ONLY Blood Culture adequate volume   Culture   Final    NO GROWTH 5 DAYS Performed at [facility], [place] [address] [place], [place] [address]    Report Status 04/07/2019 FINAL  Final  Culture, blood (routine x 2)     Status: None   Collection Time: 04/02/19 12:20 PM   Specimen: BLOOD  Result Value Ref Range Status   Specimen Description BLOOD RIGHT ANTECUBITAL  Final   Special Requests   Final    BOTTLES DRAWN AEROBIC ONLY Blood Culture adequate volume   Culture   Final    NO GROWTH 5 DAYS Performed at [facility], [place] [address], [place], [place] [address]    Report Status 04/07/2019 FINAL  Final  Urine culture     Status: None   Collection Time: 04/02/19  5:00 PM   Specimen: Urine, Catheterized  Result Value Ref Range Status   Specimen Description URINE, CATHETERIZED  Final   Special Requests NONE  Final   Culture   Final    NO GROWTH Performed at [facility], [place] [address], [place], [place] [address]    Report Status 04/03/2019 FINAL  Final  SARS Coronavirus 2 (CEPHEID - Performed in [place] hospital lab), Hosp Order     Status: None   Collection Time: 04/08/19  1:23 AM   Specimen: Nasopharyngeal Swab  Result Value Ref Range Status   SARS Coronavirus 2 NEGATIVE NEGATIVE Final    Comment:  (NOTE) If result is NEGATIVE SARS-CoV-2 target nucleic acids are NOT DETECTED. The SARS-CoV-2 RNA is generally detectable in upper and lower  respiratory specimens during the acute phase of infection. The lowest  concentration of SARS-CoV-2 viral copies this assay can detect is 250  copies / mL. A negative result does not preclude SARS-CoV-2 infection  and should not be used as the sole basis for treatment or other  patient management decisions.  A negative result may occur with  improper specimen collection / handling, submission of specimen other  than nasopharyngeal swab, presence of viral mutation(s) within the  areas targeted by this assay, and inadequate number of viral copies  (<250 copies / mL). A negative result must be combined with clinical  observations, patient history, and epidemiological information. If result is POSITIVE SARS-CoV-2 target nucleic acids are DETECTED. The SARS-CoV-2 RNA is generally detectable in upper and lower  respiratory specimens dur ing the acute phase of infection.  Positive  results are indicative of active infection with SARS-CoV-2.  Clinical  correlation with patient history and other diagnostic information is  necessary to determine patient infection status.  Positive results do  not rule out bacterial infection or co-infection with other viruses. If result is PRESUMPTIVE POSTIVE SARS-CoV-2 nucleic acids MAY BE PRESENT.   A presumptive positive result was obtained on the submitted specimen  and confirmed on repeat testing.  While 2019 novel coronavirus  (SARS-CoV-2) nucleic acids may be present in the submitted sample  additional confirmatory testing may be necessary for epidemiological  and / or clinical management purposes  to differentiate between  SARS-CoV-2 and  other Sarbecovirus currently known to infect humans.  If clinically indicated additional testing with an alternate test  methodology [phone]) is advised. The SARS-CoV-2 RNA is  generally  detectable in upper and lower respiratory sp ecimens during the acute  phase of infection. The expected result is Negative. Fact Sheet for Patients:  StrictlyIdeas.no Fact Sheet for Healthcare Providers: BankingDealers.co.za This test is not yet approved or cleared by the [place] FDA and has been authorized for detection and/or diagnosis of SARS-CoV-2 by FDA under an Emergency Use Authorization (EUA).  This EUA will remain in effect (meaning this test can be used) for the duration of the COVID-19 declaration under Section 564(b)(1) of the Act, 21 U.S.C. section 360bbb-3(b)(1), unless the authorization is terminated or revoked sooner. Performed at [facility], [place] [address], [place], [place] [address]     Radiology Studies: Dg Chest Port 1 View  Result Date: 04/11/2019 CLINICAL DATA:  Respiratory failure EXAM: PORTABLE CHEST 1 VIEW COMPARISON:  Chest radiograph 04/10/2019 FINDINGS: Monitoring leads overlie the patient. Stable cardiomegaly. Persistent elevation right hemidiaphragm. Small right pleural effusion and right lung base consolidation. Mild left basilar atelectasis. No pneumothorax. IMPRESSION: Persistent small right pleural effusion and underlying consolidation which may represent atelectasis or infection. Electronically Signed   By: Lovey Newcomer M.D.   On: 04/11/2019 08:49   Dg Chest Port 1 View  Result Date: 04/10/2019 CLINICAL DATA:  73 year old male with a history of shortness of breath EXAM: PORTABLE CHEST 1 VIEW COMPARISON:  April 08, 2019 FINDINGS: Cardiomediastinal silhouette unchanged in size and contour. No interlobular septal thickening. No pneumothorax. Similar appearance of right basilar opacity with obscuration of the right hemidiaphragm and the right heart border. Pleuroparenchymal thickening along the periphery. Linear opacity at the left base persists. No displaced fracture. IMPRESSION: Unchanged  right basilar opacity likely a combination of atelectasis/consolidation and pleural effusion. Electronically Signed   By: Corrie Mckusick D.O.   On: 04/10/2019 08:50   Scheduled Meds: . arformoterol  15 mcg Nebulization BID  . budesonide (PULMICORT) nebulizer solution  0.25 mg Nebulization BID  . carvedilol  12.5 mg Oral BID WC  . diltiazem  120 mg Oral Daily  . fluticasone  2 spray Each Nare Daily  . folic acid  1 mg Oral Daily  . furosemide  60 mg Intravenous BID  . gabapentin  400 mg Oral BID  . guaiFENesin  1,200 mg Oral BID  . insulin aspart  0-15 Units Subcutaneous TID WC  . insulin aspart  0-5 Units Subcutaneous QHS  . ipratropium  0.5 mg Nebulization TID  . levalbuterol  0.63 mg Nebulization TID  . loratadine  10 mg Oral Daily  . mesalamine  2.4 g Oral Q breakfast  . methylPREDNISolone (SOLU-MEDROL) injection  60 mg Intravenous Q8H  . multivitamin with minerals  1 tablet Oral Daily  . pantoprazole  40 mg Oral Daily  . rivaroxaban  20 mg Oral Q supper  . sodium chloride flush  3 mL Intravenous Q12H  . thiamine  100 mg Oral Daily   Continuous Infusions: . sodium chloride    . azithromycin 500 mg (04/11/19 0417)  . cefTRIAXone (ROCEPHIN)  IV Stopped (04/10/19 1653)    LOS: 3 days   Kerney Elbe, DO [facility] PAGER is on [place]  If 7PM-7AM, please contact night-coverage [email or web address] Password [email or web address] 04/11/2019, 2:12 PM

## 2019-04-12 ENCOUNTER — Inpatient Hospital Stay (HOSPITAL_COMMUNITY): Payer: No Typology Code available for payment source

## 2019-04-12 DIAGNOSIS — K5010 Crohn's disease of large intestine without complications: Secondary | ICD-10-CM

## 2019-04-12 DIAGNOSIS — Z515 Encounter for palliative care: Secondary | ICD-10-CM

## 2019-04-12 DIAGNOSIS — I4819 Other persistent atrial fibrillation: Secondary | ICD-10-CM

## 2019-04-12 DIAGNOSIS — J181 Lobar pneumonia, unspecified organism: Secondary | ICD-10-CM

## 2019-04-12 DIAGNOSIS — J90 Pleural effusion, not elsewhere classified: Secondary | ICD-10-CM

## 2019-04-12 LAB — CBC WITH DIFFERENTIAL/PLATELET
Abs Immature Granulocytes: 0.05 10*3/uL (ref 0.00–0.07)
Basophils Absolute: 0 10*3/uL (ref 0.0–0.1)
Basophils Relative: 0 %
Eosinophils Absolute: 0 10*3/uL (ref 0.0–0.5)
Eosinophils Relative: 0 %
HCT: 46.9 % (ref 39.0–52.0)
Hemoglobin: 14 g/dL (ref 13.0–17.0)
Immature Granulocytes: 0 %
Lymphocytes Relative: 8 %
Lymphs Abs: 0.9 10*3/uL (ref 0.7–4.0)
MCH: 28.8 pg (ref 26.0–34.0)
MCHC: 29.9 g/dL — ABNORMAL LOW (ref 30.0–36.0)
MCV: 96.5 fL (ref 80.0–100.0)
Monocytes Absolute: 0.4 10*3/uL (ref 0.1–1.0)
Monocytes Relative: 4 %
Neutro Abs: 10.1 10*3/uL — ABNORMAL HIGH (ref 1.7–7.7)
Neutrophils Relative %: 88 %
Platelets: 324 10*3/uL (ref 150–400)
RBC: 4.86 MIL/uL (ref 4.22–5.81)
RDW: 14.4 % (ref 11.5–15.5)
WBC: 11.5 10*3/uL — ABNORMAL HIGH (ref 4.0–10.5)
nRBC: 0 % (ref 0.0–0.2)

## 2019-04-12 LAB — GLUCOSE, CAPILLARY
Glucose-Capillary: 224 mg/dL — ABNORMAL HIGH (ref 70–99)
Glucose-Capillary: 272 mg/dL — ABNORMAL HIGH (ref 70–99)

## 2019-04-12 LAB — COMPREHENSIVE METABOLIC PANEL
ALT: 24 U/L (ref 0–44)
AST: 15 U/L (ref 15–41)
Albumin: 3 g/dL — ABNORMAL LOW (ref 3.5–5.0)
Alkaline Phosphatase: 56 U/L (ref 38–126)
Anion gap: 9 (ref 5–15)
BUN: 28 mg/dL — ABNORMAL HIGH (ref 8–23)
CO2: 46 mmol/L — ABNORMAL HIGH (ref 22–32)
Calcium: 8.5 mg/dL — ABNORMAL LOW (ref 8.9–10.3)
Chloride: 85 mmol/L — ABNORMAL LOW (ref 98–111)
Creatinine, Ser: 0.97 mg/dL (ref 0.61–1.24)
GFR calc Af Amer: >60 mL/min (ref >60)
GFR calc non Af Amer: >60 mL/min (ref >60)
Glucose, Bld: 162 mg/dL — ABNORMAL HIGH (ref 70–99)
Potassium: 3.9 mmol/L (ref 3.5–5.1)
Sodium: 140 mmol/L (ref 135–145)
Total Bilirubin: 0.5 mg/dL (ref 0.3–1.2)
Total Protein: 5.9 g/dL — ABNORMAL LOW (ref 6.5–8.1)

## 2019-04-12 LAB — MAGNESIUM: Magnesium: 2.2 mg/dL (ref 1.7–2.4)

## 2019-04-12 LAB — PHOSPHORUS: Phosphorus: 3.7 mg/dL (ref 2.5–4.6)

## 2019-04-12 MED ORDER — AZITHROMYCIN 500 MG PO TABS: 500.0000 mg | ORAL_TABLET | Freq: Every day | ORAL | 0 refills | Status: DC

## 2019-04-12 MED ORDER — METHYLPREDNISOLONE SODIUM SUCC 125 MG IJ SOLR: 60.0000 mg | Freq: Two times a day (BID) | INTRAMUSCULAR | Status: DC

## 2019-04-12 MED ORDER — AZITHROMYCIN 500 MG PO TABS: 500.0000 mg | ORAL_TABLET | Freq: Every day | ORAL | 0 refills | Status: AC

## 2019-04-12 MED ORDER — THIAMINE HCL 100 MG PO TABS: 100.0000 mg | ORAL_TABLET | Freq: Every day | ORAL | 0 refills | Status: DC

## 2019-04-12 MED ORDER — FOLIC ACID 1 MG PO TABS: 1.0000 mg | ORAL_TABLET | Freq: Every day | ORAL | 0 refills | Status: DC

## 2019-04-12 MED ORDER — IPRATROPIUM BROMIDE 0.02 % IN SOLN: 0.5000 mg | Freq: Two times a day (BID) | RESPIRATORY_TRACT | Status: DC

## 2019-04-12 MED ORDER — METHYLPREDNISOLONE SODIUM SUCC 125 MG IJ SOLR
60.0000 mg | Freq: Two times a day (BID) | INTRAMUSCULAR | Status: DC
  Administered 2019-04-12: 12:00:00 60 mg via INTRAVENOUS
  Filled 2019-04-12: qty 2

## 2019-04-12 MED ORDER — PREDNISONE 10 MG (21) PO TBPK: ORAL_TABLET | ORAL | 0 refills | Status: DC

## 2019-04-12 MED ORDER — DILTIAZEM HCL ER COATED BEADS 180 MG PO CP24: 180.0000 mg | ORAL_CAPSULE | Freq: Every day | ORAL | Status: DC

## 2019-04-12 MED ORDER — ORAL CARE MOUTH RINSE
15.0000 mL | Freq: Two times a day (BID) | OROMUCOSAL | Status: DC
  Administered 2019-04-12: 15 mL via OROMUCOSAL

## 2019-04-12 MED ORDER — POTASSIUM CHLORIDE CRYS ER 20 MEQ PO TBCR: 20.0000 meq | EXTENDED_RELEASE_TABLET | Freq: Every day | ORAL | 0 refills | Status: DC

## 2019-04-12 MED ORDER — ADULT MULTIVITAMIN W/MINERALS CH: 1.0000 | ORAL_TABLET | Freq: Every day | ORAL | 0 refills | Status: DC

## 2019-04-12 MED ORDER — DILTIAZEM HCL ER COATED BEADS 180 MG PO CP24: 180.0000 mg | ORAL_CAPSULE | Freq: Every day | ORAL | 0 refills | Status: DC

## 2019-04-12 MED ORDER — LEVALBUTEROL HCL 0.63 MG/3ML IN NEBU: 0.6300 mg | INHALATION_SOLUTION | Freq: Two times a day (BID) | RESPIRATORY_TRACT | Status: DC

## 2019-04-12 MED ORDER — DILTIAZEM HCL ER 60 MG PO CP12
60.0000 mg | ORAL_CAPSULE | Freq: Once | ORAL | Status: AC
  Administered 2019-04-12: 60 mg via ORAL
  Filled 2019-04-12: qty 1

## 2019-04-12 MED ORDER — MORPHINE SULFATE 10 MG/5ML PO SOLN: 5.0000 mg | ORAL | 0 refills | Status: DC | PRN

## 2019-04-12 MED ORDER — RIVAROXABAN 20 MG PO TABS: 20.0000 mg | ORAL_TABLET | Freq: Every day | ORAL | 0 refills | Status: DC

## 2019-04-12 MED ORDER — TORSEMIDE 20 MG PO TABS: 20.0000 mg | ORAL_TABLET | Freq: Two times a day (BID) | ORAL | 0 refills | Status: DC

## 2019-04-12 MED ORDER — GUAIFENESIN ER 600 MG PO TB12: 600.0000 mg | ORAL_TABLET | Freq: Two times a day (BID) | ORAL | 0 refills | Status: DC

## 2019-04-12 MED ORDER — INSULIN ASPART 100 UNIT/ML ~~LOC~~ SOLN: 0.0000 [IU] | Freq: Every day | SUBCUTANEOUS | Status: DC

## 2019-04-12 MED ORDER — CEFDINIR 300 MG PO CAPS: 300.0000 mg | ORAL_CAPSULE | Freq: Two times a day (BID) | ORAL | 0 refills | Status: AC

## 2019-04-12 MED ORDER — TORSEMIDE 20 MG PO TABS: 20.0000 mg | ORAL_TABLET | Freq: Two times a day (BID) | ORAL | Status: DC

## 2019-04-12 MED ORDER — INSULIN ASPART 100 UNIT/ML ~~LOC~~ SOLN
0.0000 [IU] | Freq: Three times a day (TID) | SUBCUTANEOUS | Status: DC
  Administered 2019-04-12: 11 [IU] via SUBCUTANEOUS

## 2019-04-12 MED ORDER — CEFDINIR 300 MG PO CAPS: 300.0000 mg | ORAL_CAPSULE | Freq: Two times a day (BID) | ORAL | 0 refills | Status: DC

## 2019-04-12 MED ORDER — POTASSIUM CHLORIDE CRYS ER 20 MEQ PO TBCR: 20.0000 meq | EXTENDED_RELEASE_TABLET | Freq: Every day | ORAL | Status: DC

## 2019-04-12 NOTE — Evaluation (Signed)
Clinical/Bedside Swallow Evaluation Patient Details  Name: Trinidad Petron MRN: [id number] Date of Birth: 06-11-1946  Today's Date: 04/12/2019 Time: SLP Start Time (ACUTE ONLY): 6546 SLP Stop Time (ACUTE ONLY): 0939 SLP Time Calculation (min) (ACUTE ONLY): 17 min  Past Medical History:  Past Medical History:  Diagnosis Date  . Atrial fibrillation ([place])   . COPD (chronic obstructive pulmonary disease) ([place])   . Crohn's colitis ([place])   . Diabetes mellitus without complication ([place])   . Hypertension   . Lung cancer ([place])    LLL  . OSA (obstructive sleep apnea)    Past Surgical History:  Past Surgical History:  Procedure Laterality Date  . HEMORRHOID SURGERY     HPI:  Pt is a 73 yo male with frequent hospitalizations who is admitted with acute respiratory failure secondary to COPD exacerbation. He self-reports a h/o GER, which he believes has improved since he stopped drinking alcohol. He describes what sounds like an esophagram last year at [facility]. Although he does not know the specific results, he describes esophageal precautions that were recommended. PMH also inludes: COPD, OSA, lung ca, DM, crohn's colitis, afib   Assessment / Plan / Recommendation Clinical Impression  Pt has a functional appearing oropharyngeal swallow without overt s/s of aspiration, but he does describe symptoms that could be more consistent with a primary esophageal dysphagia. He self-reports a h/o GER and c/o heartburn during PO trials. Eructation is also noted, and he describes a piece of steak, that was "too big" and that he "didn't chew enough," that recently felt lodged. He said that ultimately he was able to cough it back up to expectorate it. Pt verbalizes esophageal precautions with Mod I and acknowledges his need to follow them more strictly. He says that he usually does, but that occasionally he goes to quickly through a meal. Given the above and his good demonstration of using aspiration/esophageal  precautions, acute SLP f/u is not indicated; however, would consider dedicated esophageal and/or GI w/u if concern for aspiration persists.  SLP Visit Diagnosis: Dysphagia, unspecified (R13.10)    Aspiration Risk  Mild aspiration risk    Diet Recommendation Regular;Thin liquid   Liquid Administration via: Cup;Straw Medication Administration: Whole meds with liquid Supervision: Patient able to self feed;Intermittent supervision to cue for compensatory strategies Compensations: Slow rate;Small sips/bites;Follow solids with liquid Postural Changes: Seated upright at 90 degrees;Remain upright for at least 30 minutes after po intake    Other  Recommendations Recommended Consults: Consider esophageal assessment;Consider GI evaluation Oral Care Recommendations: Oral care BID   Follow up Recommendations None      Frequency and Duration            Prognosis Prognosis for Safe Diet Advancement: Good      Swallow Study   General HPI: Pt is a 73 yo male with frequent hospitalizations who is admitted with acute respiratory failure secondary to COPD exacerbation. He self-reports a h/o GER, which he believes has improved since he stopped drinking alcohol. He describes what sounds like an esophagram last year at [facility]. Although he does not know the specific results, he describes esophageal precautions that were recommended. PMH also inludes: COPD, OSA, lung ca, DM, crohn's colitis, afib Type of Study: Bedside Swallow Evaluation Previous Swallow Assessment: see HPI; none in chart for review Diet Prior to this Study: Regular;Thin liquids Temperature Spikes Noted: No Respiratory Status: Nasal cannula History of Recent Intubation: No Behavior/Cognition: Alert;Cooperative Oral Cavity Assessment: Within Functional Limits Oral Care Completed by SLP:  No Oral Cavity - Dentition: Edentulous Vision: Functional for self-feeding Self-Feeding Abilities: Able to feed self Patient Positioning: Other  (comment)(upright EOB) Baseline Vocal Quality: Normal Volitional Swallow: Able to elicit    Oral/Motor/Sensory Function Overall Oral Motor/Sensory Function: Within functional limits   Ice Chips Ice chips: Not tested   Thin Liquid Thin Liquid: Within functional limits Presentation: Cup;Self Fed;Straw    Nectar Thick Nectar Thick Liquid: Not tested   Honey Thick Honey Thick Liquid: Not tested   Puree Puree: Within functional limits Presentation: Self Fed;Spoon   Solid     Solid: Within functional limits Presentation: [place] 04/12/2019,9:54 AM  Pollyann Glen, M.A. [place] [facility] Environmental education officer [phone] Office [phone]

## 2019-04-12 NOTE — Progress Notes (Signed)
Flutter valve left at the bedside. The patient is asleep at this time. Will attempt to instruct patient on its use later

## 2019-04-12 NOTE — Progress Notes (Signed)
Subjective:  Shortness of breath, leg edema improved.   Objective:  Vital Signs in the last 24 hours: Temp:  [97.8 F (36.6 C)] 97.8 F (36.6 C) (08/02 0401) Pulse Rate:  [106] 106 (08/02 0401) Resp:  [21] 21 (08/02 0401) BP: (96)/(56) 96/56 (08/02 0401) SpO2:  [92 %-95 %] 95 % (08/02 0755) Weight:  [116.8 kg] 116.8 kg (08/02 0401)  Intake/Output from previous day: 08/01 0701 - 08/02 0700 In: 1080 [P.O.:1080] Out: 4310 [Urine:4310]  Physical Exam Constitutional: He is oriented to person, place, and time. He appears well-developed and well-nourished. No distress.  HENT:  Head: Normocephalic and atraumatic.  Eyes: Pupils are equal, round, and reactive to light. Conjunctivae are normal.  Neck: No JVD present.  Cardiovascular: Normal rate and intact distal pulses. An irregularly irregular rhythm present.  Pulmonary/Chest: Effort normal and breath sounds normal. He has no wheezes. He has no rales.  Abdominal: Soft. Bowel sounds are normal. There is no rebound.  Musculoskeletal:        General: Edema (1+ b/l) present.  Lymphadenopathy:    He has no cervical adenopathy.  Neurological: He is alert and oriented to person, place, and time. No cranial nerve deficit.  Skin: Skin is warm and dry.  Psychiatric: He has a normal mood and affect.  Nursing note and vitals reviewed.  Lab Results: BMP Recent Labs    04/10/19 0350 04/11/19 0708 04/12/19 0350  NA 136 139 140  K 4.4 4.2 3.9  CL 89* 88* 85*  CO2 37* 41* 46*  GLUCOSE 183* 183* 162*  BUN 25* 27* 28*  CREATININE 0.96 1.00 0.97  CALCIUM 8.8* 8.9 8.5*  GFRNONAA >60 >60 >60  GFRAA >60 >60 >60    CBC Recent Labs  Lab 04/12/19 0350  WBC 11.5*  RBC 4.86  HGB 14.0  HCT 46.9  PLT 324  MCV 96.5  MCH 28.8  MCHC 29.9*  RDW 14.4  LYMPHSABS 0.9  MONOABS 0.4  EOSABS 0.0  BASOSABS 0.0    HEMOGLOBIN A1C Lab Results  Component Value Date   HGBA1C 7.4 (H) 04/09/2019   MPG 165.68 04/09/2019    Cardiac Panel (last  3 results) No results for input(s): CKTOTAL, CKMB, TROPONINI, RELINDX in the last 8760 hours.  BNP (last 3 results) Recent Labs    04/08/19 0410  BNP 110.5*    TSH Recent Labs    04/11/19 0708  TSH 0.556     Hepatic Function Panel Recent Labs    04/10/19 0350 04/11/19 0708 04/12/19 0350  PROT 6.2* 6.7 5.9*  ALBUMIN 3.1* 3.4* 3.0*  AST 15 18 15   ALT 25 28 24   ALKPHOS 59 64 56  BILITOT 0.6 0.8 0.5    Cardiac Studies:  EKG 04/08/2019: Afib w/RVR. No ischemic changes  Echocardiogram 04/08/2019: 1. The left ventricle has normal systolic function with an ejection fraction of 60-65%. The cavity size was mild to moderately dilated. There is mildly increased left ventricular wall thickness. Left ventricular diastolic function could not be evaluated secondary to atrial fibrillation. Elevated left ventricular end-diastolic pressure. 2. The right ventricle was not well visualized. The cavity was normal. There is no increase in right ventricular wall thickness. Right ventricular systolic pressure is mildly elevated. 3. Left atrial size was mildly dilated. 4. The aortic valve is tricuspid. Mild sclerosis of the aortic valve. Aortic valve regurgitation was not assessed by color flow Doppler. 5. The aorta is normal in size and structure. 6. The inferior vena cava was dilated in  size with >50% respiratory variability.   Assessment & Recommendations:  73 y.o. Caucasian male  with hypertension, type 2 DM, COPD home oxygen, OSA, former smoker, h/o lung cancer, Chron's disease, admitted with worsening shortness of breath. Cardiology consulted for recommendations regarding anticoagulation.   Afib: Longstanding persistent Afib.  Increase diltiazem to 180 mg daily. Continue carvedilol 12.5 mg bid.  CHA2DS2Vasc score 5. Annual stroke risk 7%.  Aspirin 325 mg not recommended as per recent guidelines.He is reluctant to use eliquis. He is willing to try Xarelto. I explained  risks/benefits of anticoagulation in setting of high stroke risk with Afib. Recommend Xarelto 20 mg daily.   Shortness of breath: Multifactorial with COPD and HFpEF.  Recommend torsemide 20 mg bid and Kdur 20 mEq  Nigel Mormon, M.D. 04/12/2019, 7:50 PM [facility], [place] Pager: [phone] Office: [phone] If no answer: [phone]

## 2019-04-12 NOTE — Discharge Summary (Signed)
Physician Discharge Summary  Eddie Rivera BWG:[id number] DOB: February 14, 1946 DOA: 04/07/2019  PCP: Patient, No Pcp Per  Admit date: 04/07/2019 Discharge date: 04/12/2019  Admitted From: Home Disposition: Home with Home Health  Recommendations for Outpatient Follow-up:  1. Follow up with PCP in 1-2 weeks 2. Follow-up with cardiology within 1 to 2 weeks 3. To thoracentesis in the outpatient setting given the patient did not want to do inpatient 4. Repeat chest x-ray in 3 to 6 weeks 5. Please obtain CMP/CBC, Mag, Phos in one week 6. Please follow up on the following pending results:  Home Health: Yes Equipment/Devices: None  Discharge Condition: Stable CODE STATUS: FULL CODE Diet recommendation: Heart Healthy Carb Modified Diet with 1500 mL Fluid Restriction   Brief/Interim Summary: The patient is a 73 year old Caucasian [place] veteran with a past medical history significant for but not limited to Crohn's colitis, chronic hypoxic respiratory failure on 2 to 4 L of oxygen via nasal cannula history of significant COPD, OSA, history of prior lung cancer, former tobacco abuse smoker, history of atrial fibrillation unclear if he is on anticoagulation as well as other comorbidities who presents with a chief complaint of"blood pressure being off as well as breathing being off."States that he is progressively gotten worsening dyspnea over the last week and states that his blood pressures been off. States that he was unable to breathe so presented to the ED for further evaluation. He was worked up and admitted for a COPD exacerbation was also found to be in A. fib with RVR and started on a Cardizem drip.  Patient's respiratory status is slowly improving and he continues remain volume overloaded so we will have Cardiology evaluate.  Because he had been frequently hospitalized will obtain a palliative care consultation for goals of care discussion.  Of note the patient has been noncompliant with  fluid restriction and has been drinking water if he wants.  Discussed with him about fluid restriction and I have increased his diuresis today.  Palliative care evaluated and living will was previously completed.  Patient does not want any long-term life support or tracheostomy or PEG tube.  He remains full code and palliative is recommended grief counseling through the [facility] if he is agreeable as well as palliative support through [place].  Patient's respiratory status improved and he was feeling better and he diuresed almost 8 L.  Patient's weight had dropped 9 pounds since admission and cardiology recommending changing diuresis to p.o. and discharging home.  Prior to discharge I ordered a thoracentesis given his right pleural effusion however patient refused this and states that he wanted to go home and do it in outpatient setting.  He was deemed stable for discharge and will need to follow-up with PCP, as well as cardiology in outpatient setting and review referred to pulmonary for CPAP titration as well as thoracentesis  Discharge Diagnoses:  Principal Problem:   COPD exacerbation ([place]) Active Problems:   Crohn's colitis ([place])   Atrial fibrillation with RVR ([place])   OSA (obstructive sleep apnea)   Essential hypertension   Palliative care encounter  AcuteRespiratoryFailure onChronicRespiratoryFailurewith Hypoxia, andHypercapneaSecondary to COPD Exacerbationand ? HFpEF CHF from A Fib along with OSA and ? Right Lower PNA and pleural effusion, poA Right lower lobe pneumonia with pleural effusion POA -Admitted toSDUas an inpatient -ABG done on 04/08/2019 showed a pH of 7.305/PCO2 of 78.1/PO2 of 233.0/bicarbonate level of 38.9/O2 saturation of 100% on BiPAP -Weaned off of BiPAP and now back on  4 L nasal cannula -Continuous pulse oximetry and maintain O2 saturations greater than 90% -Continue supplemental oxygen via nasal cannula and wean to home dose -Placed  onSolumedrol 80mg  iv q8h and have reduced dose to 60 mg IV q. 8 scheduled and was reduced to 60 mg IV q12h and will continue it for today and change to 60 mg Daily tomorrow  -C/w Azithromycin500mg  iv qday for least 5 days; started IV ceftriaxone given questionable pneumonia may need to escalate given that the patient has been hospitalized in last 90 days; since WBC is elevated likely in the setting of IV steroid demargination  -StopSpiriva 1puff qdayand started the patient on Xopenex/ipratropium nebs every 6 scheduled -Dulera substitution to stop and start the patient on budesonide as well as Brovana -Albuterol neb q6hstopped due to Xopenexand q6h prn  -Started guaifenesin 1200 mg p.o. twice daily -Continue with flutter valve, incentive spirometry -Started twice daily dosing of IV Lasix 40 mg but since patient is not fluid compliant we will increase the dose to 60 mg twice daily; will change to p.o. torsemide 20 mg twice daily with 20 mEq of KCl -We will check strict I's and O's, daily weights; Patient is -7.284 liters since admission; weight is down 9 pounds since admission -BNP was 110.5 -Continue supplemental oxygen via nasal cannula and wean O2 as tolerated to home dose -CXR on Admission showed "Right lower lobe opacity, likely reflecting a combination of atelectasis and right pleural effusion, mildly progressive." -Repeat chest x-ray this AM showed "Persistent small right pleural effusion and underlying consolidation which may represent atelectasis or infection.." -We will place the patient on CPAP at night; may need pulmonary evaluation if not improving significantly -Cardiology is consulted for further evaluation recommendations appreciate assistance -Ordered thoracentesis for right pleural effusion and patient refused -will obtain SLP given his right lower lobe consolidation which raises the question for aspiration and patient did well and did not aspirate -We will also obtain a  Palliative Consultation given patient's multiple admissions the last few weeks; I have discussed with the patient and also started the patient on morphine sublingually for dyspnea and this is improved the patient's shortness of breath -Resume home inhalers at discharge -We will change to a Sterapred taper and also change to p.o. antibiotics -Follow-up closely with PCP as well as cardiology in outpatient setting -Repeat chest x-ray in 3 to 6 weeks and may need pulmonary evaluation in outpatient setting -Offered the patient a thoracentesis prior to discharge for right pleural effusion and consolidation patient refused and wants to go home and was back on her home baseline oxygen -PT and OT recommending home health and patient is a high risk for readmissions given his multiple readmissions in the last few weeks  Afib with RVR, improved -Has an elevated CHA2DS2-VASc of 5 -C/wTelemetry Monitoring  -Trop I q3h x23flat and negative -Checked TSH and was 0.556 -Check Cardiac ECHOCardiogram and showed left ventricule systolic function with an EF of 60 to 65% left ventricular diastolic function could not be evaluated due to A. fib.  There is no increase in right ventricle wall thickness and right ventricular systolic pressure was mildly elevated.  There is musculatures of the aortic valve and aortic valve regurgitation was not assessed by color Doppler flow inferior vena cava was dilated in size with greater than 50% respiratory variability -Discussed the case with cardiology Dr. Randa Lynn who recommends discontinuing aspirin325 mg po Daily and starting the patient on apixaban 5 mg p.o. twice daily -Unclear why not  on anticoagulationand [facility] [place] [facility] reasons are started on her -ContCarvedilol 12.5mg  po bid -Cardizem gtt  Now stopped and transitioned to po 120 mg Cardizem CD; May need to Titrate up and did so 180 mg CD daily -Will need Cardiology  Evaluation and further recommendations about Anticoagulation and have consulted Dr. Virgina Jock for further evaluation; Dr. Virgina Jock recommended initially apixaban but after discussing with the patient patient did not want apixaban due to bruising so we will start the patient on Xarelto 20 mg daily per cardiology recommendations; will continue Xarelto 20 mg p.o. daily at discharge and give the patient a free 30-day card -Appreciate further cardiology evaluation and assistance -Follow-up with cardiology in outpatient setting  VolumeOverloadwith Acute Exacerbation of Chronic Diastolic Heart Failure with Preserved EF -We will check a Cardiac Echocardiogram and systolic function was normal but diastolic function could not be assessed due to A. fib -Checked BNP and was 110. -Strict I's and O's and daily weights; Fluid restrict to 1500 mL's -Given Diuresis with Lasix 60 mg twice daily and changed to torsemide 20 mg p.o. twice daily per cardiology recommendations with 20 mEq of potassium chloride -We will have cardiology weigh in for formal evaluation -Patient is -7.284 liters; weight is down 9 pounds -Continue to monitor for signs of volume overload -Appreciate cardiology evaluation assistance and cardiology recommending sending the patient home on torsemide 20 mg p.o. twice daily once he is stable for discharge  CrohnsColitis -ContMesalamine 0.4 g daily with breakfast  GERD -Cont PPIOmeprazole   Hyperglycemia in the setting of Diabetes Mellitus Type 2 -Hemoglobin A1c was 7.4 -Blood sugars are likely worsened in the setting of IV steroid demargination -Sugars have been ranging from 173-272 -Placed on a sensitive NovoLog sliding scale and have now increases to moderate NovoLog sliding scale before meals and at bedtime  Obesity -Estimated body mass index is 34.94 kg/m as calculated from the following:   Height as of this encounter: 6' (1.829 m).   Weight as of this encounter: 116.8  kg. -Weight Loss and Dietary Counseling Given  Metabolic Alkalosis -Likely in the setting of diuresis and COPD -Chloride was 85 and CO2 was 46; Compensated -We will need to continue to monitor carefully -Repeat CMP as an outpatient  -Continue monitor and trend with close PCP follow   Leukocytosis, slightly improving -Patient's WBC went from 12.7 -> 13.0 -> 14.1 -> 11.5 x2 -In the setting of IV steroid demargination and reactive from acute exacerbation of COPD  -Continue monitor for signs and symptoms of infection -Continue with IV Azithromycin and addition of IV ceftriaxone for suspected right lower lobe consolidation -Repeat CBC in a.m.  Macrocytic/Normocytic Anemia -Patient's hemoglobin/hematocrit went from 13.5/47.8 -> 12.6/41.8 -> 13.9/47.2 -> 14.0/46.9 -Check Anemia panel as an outpatient -We will monitor for signs and symptoms of bleeding now that he is anticoagulated; currently no overt bleeding noted -Repeat CBC in AM   History of Alcohol Abuse, stable -Placed on CIWA protocol and will need to continue to Monitor Closely however after further discussion he no longer drinks and last drink was about 5 years ago -Continue with folic acid, multivitamin as well as thiamine -We will discontinue CIWA protocol now and will need to follow-up in outpatient setting  Discharge Instructions  Discharge Instructions    ([place]) Call MD:  Anytime you have any of the following symptoms: 1) 3 pound weight gain in 24 hours or 5 pounds in 1 week 2) shortness  of breath, with or without a dry hacking cough 3) swelling in the hands, feet or stomach 4) if you have to sleep on extra pillows at night in order to breathe.   Complete by: As directed    Call MD for:  difficulty breathing, headache or visual disturbances   Complete by: As directed    Call MD for:  extreme fatigue   Complete by: As directed    Call MD for:  hives   Complete by: As directed    Call MD for:   persistant dizziness or light-headedness   Complete by: As directed    Call MD for:  persistant nausea and vomiting   Complete by: As directed    Call MD for:  redness, tenderness, or signs of infection (pain, swelling, redness, odor or green/yellow discharge around incision site)   Complete by: As directed    Call MD for:  severe uncontrolled pain   Complete by: As directed    Call MD for:  temperature >100.4   Complete by: As directed    Diet - low sodium heart healthy   Complete by: As directed    1500 mL Fluid Restriction   Diet Carb Modified   Complete by: As directed    Increase activity slowly   Complete by: As directed      Allergies as of 04/12/2019      Reactions   Atorvastatin Other (See Comments)   Patient doesn't recall the reaction (??)      Medication List    STOP taking these medications   aspirin EC 325 MG tablet     TAKE these medications   albuterol (2.5 MG/3ML) 0.083% nebulizer solution Commonly known as: PROVENTIL Take 2.5 mg by nebulization See admin instructions. Nebulize 1 vial every 4-6 hours as needed for shortness of breath or wheezing   ProAir HFA 108 (90 Base) MCG/ACT inhaler Generic drug: albuterol Inhale 2 puffs into the lungs every 6 (six) hours as needed for wheezing or shortness of breath.   azithromycin 500 MG tablet Commonly known as: Zithromax Take 1 tablet (500 mg total) by mouth daily for 3 days. Take 1 tablet daily for 3 days.   budesonide-formoterol 160-4.5 MCG/ACT inhaler Commonly known as: SYMBICORT Inhale 2 puffs into the lungs 2 (two) times daily.   carvedilol 12.5 MG tablet Commonly known as: COREG Take 12.5 mg by mouth 2 (two) times daily with a meal.   cefdinir 300 MG capsule Commonly known as: OMNICEF Take 1 capsule (300 mg total) by mouth 2 (two) times daily for 5 days.   cetirizine 10 MG tablet Commonly known as: ZYRTEC Take 10 mg by mouth daily.   clotrimazole 1 % cream Commonly known as: LOTRIMIN Apply 1  application topically See admin instructions. Apply a small amount daily as directed/as needed to legs and feet   diltiazem 180 MG 24 hr capsule Commonly known as: CARDIZEM CD Take 1 capsule (180 mg total) by mouth daily. Start taking on: April 13, 2019   fluticasone 50 MCG/ACT nasal spray Commonly known as: FLONASE Place 2 sprays into both nostrils daily.   folic acid 1 MG tablet Commonly known as: FOLVITE Take 1 tablet (1 mg total) by mouth daily. Start taking on: April 13, 2019   gabapentin 400 MG capsule Commonly known as: NEURONTIN Take 400 mg by mouth 2 (two) times daily.   guaiFENesin 600 MG 12 hr tablet Commonly known as: MUCINEX Take 1 tablet (600 mg total) by mouth  2 (two) times daily.   loperamide 2 MG capsule Commonly known as: IMODIUM Take 4 mg by mouth 2 (two) times daily as needed for diarrhea or loose stools.   mesalamine 1.2 g EC tablet Commonly known as: LIALDA Take 2.4 g by mouth daily with breakfast.   morphine 10 MG/5ML solution Place 2.5 mLs (5 mg total) under the tongue every 4 (four) hours as needed for severe pain.   multivitamin with minerals Tabs tablet Take 1 tablet by mouth daily. Start taking on: April 13, 2019   omeprazole 40 MG capsule Commonly known as: PRILOSEC Take 40 mg by mouth daily before breakfast.   potassium chloride SA 20 MEQ tablet Commonly known as: K-DUR Take 1 tablet (20 mEq total) by mouth daily.   predniSONE 10 MG (21) Tbpk tablet Commonly known as: STERAPRED UNI-PAK 21 TAB Take 6 tablets on day 1, 5 tablets on day 2, 4 tablets on day 3, 3 tablets on day 4, 2 tablets on day 5, 1 tablet on day 6, 9 stop on day 7   rivaroxaban 20 MG Tabs tablet Commonly known as: XARELTO Take 1 tablet (20 mg total) by mouth daily with supper.   thiamine 100 MG tablet Take 1 tablet (100 mg total) by mouth daily. Start taking on: April 13, 2019   tiotropium 18 MCG inhalation capsule Commonly known as: SPIRIVA Place 18 mcg into  inhaler and inhale daily.   torsemide 20 MG tablet Commonly known as: DEMADEX Take 1 tablet (20 mg total) by mouth 2 (two) times daily.      [facility] Follow up.   Specialty: Home Health Services Why: Registered Nurse and Physical Therapy- office to call the [facility] MD- to get authorization for services, then the agency will be able to provide services.  Contact information: [address] [place] [address] [phone]        Nigel Mormon, MD. Call.   Specialties: Cardiology, Radiology Why: Follow up within 1-2 weeks for Outpatient Follow up  Contact information: [place] [address] [phone]        [facility] Follow up.   Why: Nurse will contact you this week for palliative services and grief counseling.  Contact information: [place] [address] [phone]          Allergies  Allergen Reactions  . Atorvastatin Other (See Comments)    Patient doesn't recall the reaction (??)   Consultations:  Cardiology  Palliative care medicine  Procedures/Studies: Dg Chest Port 1 View  Result Date: 04/12/2019 CLINICAL DATA:  Shortness of breath for 1 month EXAM: PORTABLE CHEST 1 VIEW COMPARISON:  April 11, 2019 FINDINGS: Mediastinal contour is stable. Heart size is enlarged. There is mild linear atelectasis of the left mid lung. The left lung is otherwise clear. There is consolidation of the right lung base with right pleural effusion unchanged. The bony structures are stable. IMPRESSION: Consolidation of right lung base with right pleural effusion unchanged. Linear atelectasis of the left mid lung. Electronically Signed   By: Abelardo Diesel M.D.   On: 04/12/2019 10:51   Dg Chest Port 1 View  Result Date: 04/11/2019 CLINICAL DATA:  Respiratory failure EXAM: PORTABLE CHEST 1 VIEW COMPARISON:  Chest radiograph 04/10/2019 FINDINGS: Monitoring leads overlie  the patient. Stable cardiomegaly. Persistent elevation right hemidiaphragm. Small right pleural effusion and right lung base consolidation. Mild left basilar atelectasis. No  pneumothorax. IMPRESSION: Persistent small right pleural effusion and underlying consolidation which may represent atelectasis or infection. Electronically Signed   By: Lovey Newcomer M.D.   On: 04/11/2019 08:49   Dg Chest Port 1 View  Result Date: 04/10/2019 CLINICAL DATA:  73 year old male with a history of shortness of breath EXAM: PORTABLE CHEST 1 VIEW COMPARISON:  April 08, 2019 FINDINGS: Cardiomediastinal silhouette unchanged in size and contour. No interlobular septal thickening. No pneumothorax. Similar appearance of right basilar opacity with obscuration of the right hemidiaphragm and the right heart border. Pleuroparenchymal thickening along the periphery. Linear opacity at the left base persists. No displaced fracture. IMPRESSION: Unchanged right basilar opacity likely a combination of atelectasis/consolidation and pleural effusion. Electronically Signed   By: Corrie Mckusick D.O.   On: 04/10/2019 08:50   Dg Chest Port 1 View  Result Date: 04/08/2019 CLINICAL DATA:  Shortness of breath EXAM: PORTABLE CHEST 1 VIEW COMPARISON:  04/02/2019 FINDINGS: Right lower lobe opacity, likely reflecting a combination of atelectasis and right pleural effusion, mildly progressive. No frank interstitial edema.  No pneumothorax. Cardiomegaly. IMPRESSION: Right lower lobe opacity, likely reflecting a combination of atelectasis and right pleural effusion, mildly progressive. Electronically Signed   By: Julian Hy M.D.   On: 04/08/2019 00:35   Dg Chest Port 1 View  Result Date: 04/02/2019 CLINICAL DATA:  Respiratory failure. EXAM: PORTABLE CHEST 1 VIEW COMPARISON:  Radiograph same day. FINDINGS: Stable cardiomediastinal silhouette. Endotracheal and nasogastric tubes are in good position. No pneumothorax is noted. Left lung is clear. Stable  right basilar atelectasis is noted with small right pleural effusion. Bony thorax unremarkable. IMPRESSION: Stable support apparatus. Stable right basilar subsegmental atelectasis is noted with small right pleural effusion. Electronically Signed   By: Marijo Conception M.D.   On: 04/02/2019 12:10   ECHOCARDIOGRAM IMPRESSIONS   1. The left ventricle has normal systolic function with an ejection fraction of 60-65%. The cavity size was mild to moderately dilated. There is mildly increased left ventricular wall thickness. Left ventricular diastolic function could not be evaluated secondary to atrial fibrillation. Elevated left ventricular end-diastolic pressure. 2. The right ventricle was not well visualized. The cavity was normal. There is no increase in right ventricular wall thickness. Right ventricular systolic pressure is mildly elevated. 3. Left atrial size was mildly dilated. 4. The aortic valve is tricuspid. Mild sclerosis of the aortic valve. Aortic valve regurgitation was not assessed by color flow Doppler. 5. The aorta is normal in size and structure. 6. The inferior vena cava was dilated in size with >50% respiratory variability.  FINDINGS Left Ventricle: The left ventricle has normal systolic function, with an ejection fraction of 60-65%. The cavity size was mild to moderately dilated. There is mildly increased left ventricular wall thickness. Left ventricular diastolic function could  not be evaluated secondary to atrial fibrillation. Elevated left ventricular end-diastolic pressure  Right Ventricle: The right ventricle was not well visualized. The cavity was normal. There is no increase in right ventricular wall thickness. Right ventricular systolic pressure is mildly elevated.  Left Atrium: Left atrial size was mildly dilated.  Right Atrium: Right atrial size was normal in size.  Interatrial Septum: No atrial level shunt detected by color flow  Doppler.  Pericardium: There is no evidence of pericardial effusion.  Mitral Valve: The mitral valve is normal in structure. Mitral valve regurgitation was not assessed by color flow Doppler.  Tricuspid Valve: The tricuspid valve is normal in structure. Tricuspid valve regurgitation is mild by color  flow Doppler.  Aortic Valve: The aortic valve is tricuspid Mild sclerosis of the aortic valve. Aortic valve regurgitation was not assessed by color flow Doppler.  Pulmonic Valve: The pulmonic valve was normal in structure. Pulmonic valve regurgitation is not visualized by color flow Doppler.  Aorta: The aorta is normal in size and structure.  Venous: The inferior vena cava measures 2.43 cm, is dilated in size with greater than 50% respiratory variability.   +--------------+--------++ LEFT VENTRICLE  +----------------+----------++ +--------------+--------++ Diastology   PLAX 2D   +----------------+----------++ +--------------+--------++ LV e' lateral: 12.40 cm/s LVIDd: 6.10 cm  +----------------+----------++ +--------------+--------++ LV E/e' lateral:9.0  LVIDs: 5.20 cm  +----------------+----------++ +--------------+--------++ LV e' medial: 6.09 cm/s  LV PW: 1.20 cm  +----------------+----------++ +--------------+--------++ LV E/e' medial: 18.2  LV IVS: 1.10 cm  +----------------+----------++ +--------------+--------++ LVOT diam: 2.20 cm  +--------------+--------++ LV SV: 57 ml  +--------------+--------++ LV SV Index: 23.02  +--------------+--------++ LVOT Area: 3.80 cm +--------------+--------++    +--------------+--------++  +---------------+---------++ RIGHT VENTRICLE  +---------------+---------++ RV Basal diam: 3.20 cm  +---------------+---------++ RV S prime: 7.40  cm/s +---------------+---------++ TAPSE (M-mode):2.9 cm  +---------------+---------++ RVSP: 37.3 mmHg +---------------+---------++  +---------------+-------++-----------++ LEFT ATRIUM  Index  +---------------+-------++-----------++ LA diam: 4.20 cm1.76 cm/m  +---------------+-------++-----------++ LA Vol (A2C): 65.8 ml27.57 ml/m +---------------+-------++-----------++ LA Vol (A4C): 64.1 ml26.86 ml/m +---------------+-------++-----------++ LA Biplane Vol:65.1 ml27.28 ml/m +---------------+-------++-----------++ +------------+---------++-----------++ RIGHT ATRIUM Index  +------------+---------++-----------++ RA Pressure:8.00 mmHg  +------------+---------++-----------++ RA Area: 15.60 cm  +------------+---------++-----------++ RA Volume: 35.80 ml 15.00 ml/m +------------+---------++-----------++ +------------+-----------++ AORTIC VALVE  +------------+-----------++ LVOT Vmax: 99.00 cm/s  +------------+-----------++ LVOT Vmean: 59.600 cm/s +------------+-----------++ LVOT VTI: 0.159 m  +------------+-----------++  +-------------+-------++ AORTA   +-------------+-------++ Ao Root diam:3.60 cm +-------------+-------++  +--------------+----------++ +---------------+-----------++ MITRAL VALVE   TRICUSPID VALVE  +--------------+----------++ +---------------+-----------++ MV Area (PHT):6.63 cm  TR Peak grad: 29.3 mmHg  +--------------+----------++ +---------------+-----------++ MV PHT: 33.20 msec TR Vmax: 276.00 cm/s +--------------+----------++ +---------------+-----------++ MV Decel Time:115 msec  Estimated RAP: 8.00 mmHg  +--------------+----------++  +---------------+-----------++ +--------------+-----------++ RVSP: 37.3 mmHg  MV E velocity:111.00 cm/s +---------------+-----------++ +--------------+-----------++ +--------------+-------+ SHUNTS   +--------------+-------+ Systemic VTI: 0.16 m  +--------------+-------+ Systemic Diam:2.20 cm +--------------+-------+  +---------+-------+ IVC   +---------+-------+ IVC diam:2.43 cm +---------+-------+  Subjective: Seen and examined at bedside and states his dyspnea was better.  Felt back to baseline and denies any chest pain, nausea or vomiting.  I discussed with him about a thoracentesis given his right pleural effusion and he refused and states he will do an outpatient wanting to go home now.  No other concerns or complaints at this time and will change antibiotics to p.o. and sent home with a prednisone taper as well as Cardizem, torsemide and and Xarelto.  He will need to follow-up with PCP, pulmonary, as well as cardiology in outpatient setting and repeat chest x-ray in 3 to 6 weeks and is understandable and agreeable with the plan of care  Discharge Exam: Vitals:   04/12/19 0754 04/12/19 0755  BP:    Pulse:    Resp:    Temp:    SpO2: 92% 95%   Vitals:   04/12/19 0401 04/12/19 0753 04/12/19 0754 04/12/19 0755  BP: (!) 96/56     Pulse: (!) 106     Resp: (!) 21     Temp: 97.8 F (36.6 C)     TempSrc: Oral     SpO2: 95% 94% 92% 95%  Weight: 116.8 kg     Height:       General: Pt is alert, awake, not in acute distress Cardiovascular: Irregularly  irregular and slightly tachycardic, S1/S2 +, no rubs, no gallops Respiratory: Diminished bilaterally more so on the right with some slight crackles, no wheezing, no  rhonchi; unlabored breathing and he is back on his home oxygen regimen Abdominal: Soft, NT, distended second body habitus, bowel sounds + Extremities: 1+ lower extremity edema, no cyanosis  The results of significant diagnostics from this hospitalization (including imaging, microbiology, ancillary and laboratory) are listed below for reference.    Microbiology: Recent Results (from the past 240 hour(s))  SARS Coronavirus 2 (CEPHEID - Performed in [place] hospital lab), Hosp Order     Status: None   Collection Time: 04/08/19  1:23 AM   Specimen: Nasopharyngeal Swab  Result Value Ref Range Status   SARS Coronavirus 2 NEGATIVE NEGATIVE Final    Comment: (NOTE) If result is NEGATIVE SARS-CoV-2 target nucleic acids are NOT DETECTED. The SARS-CoV-2 RNA is generally detectable in upper and lower  respiratory specimens during the acute phase of infection. The lowest  concentration of SARS-CoV-2 viral copies this assay can detect is 250  copies / mL. A negative result does not preclude SARS-CoV-2 infection  and should not be used as the sole basis for treatment or other  patient management decisions.  A negative result may occur with  improper specimen collection / handling, submission of specimen other  than nasopharyngeal swab, presence of viral mutation(s) within the  areas targeted by this assay, and inadequate number of viral copies  (<250 copies / mL). A negative result must be combined with clinical  observations, patient history, and epidemiological information. If result is POSITIVE SARS-CoV-2 target nucleic acids are DETECTED. The SARS-CoV-2 RNA is generally detectable in upper and lower  respiratory specimens dur ing the acute phase of infection.  Positive  results are indicative of active infection with SARS-CoV-2.  Clinical  correlation with patient history and other diagnostic information is  necessary to determine patient infection status.  Positive results do  not rule  out bacterial infection or co-infection with other viruses. If result is PRESUMPTIVE POSTIVE SARS-CoV-2 nucleic acids MAY BE PRESENT.   A presumptive positive result was obtained on the submitted specimen  and confirmed on repeat testing.  While 2019 novel coronavirus  (SARS-CoV-2) nucleic acids may be present in the submitted sample  additional confirmatory testing may be necessary for epidemiological  and / or clinical management purposes  to differentiate between  SARS-CoV-2 and other Sarbecovirus currently known to infect humans.  If clinically indicated additional testing with an alternate test  methodology [phone]) is advised. The SARS-CoV-2 RNA is generally  detectable in upper and lower respiratory sp ecimens during the acute  phase of infection. The expected result is Negative. Fact Sheet for Patients:  StrictlyIdeas.no Fact Sheet for Healthcare Providers: BankingDealers.co.za This test is not yet approved or cleared by the [place] FDA and has been authorized for detection and/or diagnosis of SARS-CoV-2 by FDA under an Emergency Use Authorization (EUA).  This EUA will remain in effect (meaning this test can be used) for the duration of the COVID-19 declaration under Section 564(b)(1) of the Act, 21 U.S.C. section 360bbb-3(b)(1), unless the authorization is terminated or revoked sooner. Performed at [facility], [place] [address] [place], [place] [address]      Labs: BNP (last 3 results) Recent Labs    04/08/19 0410  BNP 366.4*   Basic Metabolic Panel: Recent Labs  Lab 04/08/19 0410 04/09/19 0350 04/10/19 0350 04/11/19 0708 04/12/19 0350  NA 140 139  136 139 140  K 5.1 5.0 4.4 4.2 3.9  CL 97* 91* 89* 88* 85*  CO2 35* 38* 37* 41* 46*  GLUCOSE 137* 185* 183* 183* 162*  BUN 8 18 25* 27* 28*  CREATININE 0.93 0.90 0.96 1.00 0.97  CALCIUM 8.8* 9.0 8.8* 8.9 8.5*  MG  --  2.0 2.2 2.4 2.2  PHOS  --  2.6 3.0  3.8 3.7   Liver Function Tests: Recent Labs  Lab 04/08/19 0410 04/09/19 0350 04/10/19 0350 04/11/19 0708 04/12/19 0350  AST 24 17 15 18 15   ALT 30 26 25 28 24   ALKPHOS 79 68 59 64 56  BILITOT 1.0 0.7 0.6 0.8 0.5  PROT 7.0 6.3* 6.2* 6.7 5.9*  ALBUMIN 3.4* 3.1* 3.1* 3.4* 3.0*   No results for input(s): LIPASE, AMYLASE in the last 168 hours. No results for input(s): AMMONIA in the last 168 hours. CBC: Recent Labs  Lab 04/08/19 0006  04/08/19 0410 04/09/19 0350 04/10/19 0350 04/11/19 0708 04/12/19 0350  WBC 12.9*  --  12.7* 13.0* 14.1* 11.5* 11.5*  NEUTROABS 9.5*  --   --  11.6* 12.3* 10.2* 10.1*  HGB 13.2   < > 13.5 12.5* 12.6* 13.9 14.0  HCT 46.7   < > 47.8 42.4 41.8 47.2 46.9  MCV 103.5*  --  103.5* 99.1 96.1 98.1 96.5  PLT 220  --  235 248 275 329 324   < > = values in this interval not displayed.   Cardiac Enzymes: No results for input(s): CKTOTAL, CKMB, CKMBINDEX, TROPONINI in the last 168 hours. BNP: Invalid input(s): POCBNP CBG: Recent Labs  Lab 04/10/19 2140 04/11/19 1117 04/11/19 2111 04/12/19 0809 04/12/19 1113  GLUCAP 187* 183* 195* 224* 272*   D-Dimer No results for input(s): DDIMER in the last 72 hours. Hgb A1c No results for input(s): HGBA1C in the last 72 hours. Lipid Profile No results for input(s): CHOL, HDL, LDLCALC, TRIG, CHOLHDL, LDLDIRECT in the last 72 hours. Thyroid function studies Recent Labs    04/11/19 0708  TSH 0.556   Anemia work up No results for input(s): VITAMINB12, FOLATE, FERRITIN, TIBC, IRON, RETICCTPCT in the last 72 hours. Urinalysis No results found for: COLORURINE, APPEARANCEUR, [place], [place], GLUCOSEU, [place], [place], KETONESUR, PROTEINUR, UROBILINOGEN, NITRITE, LEUKOCYTESUR Sepsis Labs Invalid input(s): PROCALCITONIN,  WBC,  LACTICIDVEN Microbiology Recent Results (from the past 240 hour(s))  SARS Coronavirus 2 (CEPHEID - Performed in [place] hospital lab), Hosp Order     Status: None   Collection  Time: 04/08/19  1:23 AM   Specimen: Nasopharyngeal Swab  Result Value Ref Range Status   SARS Coronavirus 2 NEGATIVE NEGATIVE Final    Comment: (NOTE) If result is NEGATIVE SARS-CoV-2 target nucleic acids are NOT DETECTED. The SARS-CoV-2 RNA is generally detectable in upper and lower  respiratory specimens during the acute phase of infection. The lowest  concentration of SARS-CoV-2 viral copies this assay can detect is 250  copies / mL. A negative result does not preclude SARS-CoV-2 infection  and should not be used as the sole basis for treatment or other  patient management decisions.  A negative result may occur with  improper specimen collection / handling, submission of specimen other  than nasopharyngeal swab, presence of viral mutation(s) within the  areas targeted by this assay, and inadequate number of viral copies  (<250 copies / mL). A negative result must be combined with clinical  observations, patient history, and epidemiological information. If result is POSITIVE SARS-CoV-2 target nucleic acids  are DETECTED. The SARS-CoV-2 RNA is generally detectable in upper and lower  respiratory specimens dur ing the acute phase of infection.  Positive  results are indicative of active infection with SARS-CoV-2.  Clinical  correlation with patient history and other diagnostic information is  necessary to determine patient infection status.  Positive results do  not rule out bacterial infection or co-infection with other viruses. If result is PRESUMPTIVE POSTIVE SARS-CoV-2 nucleic acids MAY BE PRESENT.   A presumptive positive result was obtained on the submitted specimen  and confirmed on repeat testing.  While 2019 novel coronavirus  (SARS-CoV-2) nucleic acids may be present in the submitted sample  additional confirmatory testing may be necessary for epidemiological  and / or clinical management purposes  to differentiate between  SARS-CoV-2 and other Sarbecovirus currently known  to infect humans.  If clinically indicated additional testing with an alternate test  methodology [phone]) is advised. The SARS-CoV-2 RNA is generally  detectable in upper and lower respiratory sp ecimens during the acute  phase of infection. The expected result is Negative. Fact Sheet for Patients:  StrictlyIdeas.no Fact Sheet for Healthcare Providers: BankingDealers.co.za This test is not yet approved or cleared by the [place] FDA and has been authorized for detection and/or diagnosis of SARS-CoV-2 by FDA under an Emergency Use Authorization (EUA).  This EUA will remain in effect (meaning this test can be used) for the duration of the COVID-19 declaration under Section 564(b)(1) of the Act, 21 U.S.C. section 360bbb-3(b)(1), unless the authorization is terminated or revoked sooner. Performed at [facility], [place] [address], [place], [place] [address]    Time coordinating discharge: 35 minutes  SIGNED:  Kerney Elbe, DO [facility] 04/12/2019, 5:40 PM Pager is on [place]  If 7PM-7AM, please contact night-coverage [email or web address] Password [email or web address]

## 2019-04-12 NOTE — Evaluation (Signed)
Occupational Therapy Evaluation Patient Details Name: Eddie Rivera Tristan MRN: [id number] DOB: August 01, 1946 Today's Date: 04/12/2019    History of Present Illness Pt is a 73 y/o male admitted secondary to acute respiratory failure from COPD exacerbation. PMH includes a fib, OSA on CPAP, HTN, lung cancer, and COPD on home O2.    Clinical Impression   Pt PTA : living alone, pt was independent with ADL and mobility, requiring hired help for IADL tasks inside/outside. Family is very supportive. Pt reports "I just lost my mother a few weeks ago." Pt visibly sad about instance and reported that his sister visits him to reminisce. Currently, limited by poor activity tolerance. Pt sits down for most ADL at home and continues to do so here. pt able to stand for grooming at sink. Pt ambulating in room with supervisionA. Pt given handout and education provided for energy conservation. Pt interested in buy hip kit for LB needs. Pt would greatly benefit from continued OT skilled services for ADL,mobility and energy conservation in [place] setting. OT signing off at this time.  O2 >90% on 3-4L O2; hr ,100 BPM w/ exertion.      Follow Up Recommendations  Home health OT;Supervision - Intermittent    Equipment Recommendations  None recommended by OT    Recommendations for Other Services       Precautions / Restrictions Precautions Precautions: Fall;Other (comment) Precaution Comments: watch O2 sats Restrictions Weight Bearing Restrictions: No      Mobility Bed Mobility Overal bed mobility: Modified Independent                Transfers Overall transfer level: Needs assistance Equipment used: Rolling walker (2 wheeled) Transfers: Sit to/from Stand Sit to Stand: Supervision         General transfer comment: no physical assist required    Balance Overall balance assessment: Needs assistance Sitting-balance support: No upper extremity supported;Feet supported Sitting balance-Leahy  Scale: Good     Standing balance support: No upper extremity supported Standing balance-Leahy Scale: Fair Standing balance comment: for a short distance                           ADL either performed or assessed with clinical judgement   ADL Overall ADL's : At baseline                                     Functional mobility during ADLs: Supervision/safety General ADL Comments: Pt sits down for most ADL at home and continues to do so here. pt able to stand for grooming at sink and reports at home, he hires family/friends to help around the inside/outside of home as he has no energy.     Vision Baseline Vision/History: Wears glasses Wears Glasses: At all times Vision Assessment?: No apparent visual deficits     Perception     Praxis      Pertinent Vitals/Pain Pain Assessment: No/denies pain     Hand Dominance     Extremity/Trunk Assessment Upper Extremity Assessment Upper Extremity Assessment: Generalized weakness   Lower Extremity Assessment Lower Extremity Assessment: Generalized weakness   Cervical / Trunk Assessment Cervical / Trunk Assessment: Normal   Communication Communication Communication: No difficulties   Cognition Arousal/Alertness: Awake/alert Behavior During Therapy: WFL for tasks assessed/performed Overall Cognitive Status: Within Functional Limits for tasks assessed  General Comments  O2 levels >90% 4L O2 wth activity. Energy conservation handout provided.    Exercises     Shoulder Instructions      Home Living Family/patient expects to be discharged to:: Private residence Living Arrangements: Alone Available Help at Discharge: Family;Available PRN/intermittently Type of Home: House Home Access: Level entry     Home Layout: One level     Bathroom Shower/Tub: Occupational psychologist: Handicapped height     Home Equipment: Cane - single  point;Walker - 2 wheels;Shower seat - built in;Grab bars - tub/shower;Hand held shower head;Grab bars - toilet   Additional Comments: reports wanting hip kit      Prior Functioning/Environment Level of Independence: Independent with assistive device(s)        Comments: Reports using cane vs RW for mobility         OT Problem List: Decreased strength;Decreased activity tolerance;Impaired balance (sitting and/or standing);Decreased safety awareness      OT Treatment/Interventions:      OT Goals(Current goals can be found in the care plan section) Acute Rehab OT Goals Patient Stated Goal: to go home  OT Goal Formulation: With patient Potential to Achieve Goals: Good  OT Frequency:     Barriers to D/C:            Co-evaluation              AM-PAC OT "6 Clicks" Daily Activity     Outcome Measure Help from another person eating meals?: None Help from another person taking care of personal grooming?: A Little Help from another person toileting, which includes using toliet, bedpan, or urinal?: A Little Help from another person bathing (including washing, rinsing, drying)?: A Little Help from another person to put on and taking off regular upper body clothing?: A Little Help from another person to put on and taking off regular lower body clothing?: A Little 6 Click Score: 19   End of Session Equipment Utilized During Treatment: Gait belt;Oxygen Nurse Communication: Mobility status  Activity Tolerance: Patient tolerated treatment well Patient left: in chair;with call bell/phone within reach  OT Visit Diagnosis: Other abnormalities of gait and mobility (R26.89);Muscle weakness (generalized) (M62.81)                Time: 0689-3406 OT Time Calculation (min): 25 min Charges:  OT General Charges $OT Visit: 1 Visit OT Evaluation $OT Eval Moderate Complexity: 1 Mod OT Treatments $Self Care/Home Management : 8-22 mins  Ebony Hail Harold Hedge) Marsa Aris OTR/L Acute Rehabilitation  Services Pager: [phone] Office: [place] 04/12/2019, 4:00 PM

## 2019-04-12 NOTE — Progress Notes (Signed)
Physical Therapy Evaluation Patient Details Name: Eddie Rivera MRN: [id number] DOB: 1946/03/27 Today's Date: 04/12/2019   History of Present Illness  Pt is a 73 y/o male admitted secondary to acute respiratory failure from COPD exacerbation. PMH includes a fib, OSA on CPAP, HTN, lung cancer, and COPD on home O2.   Clinical Impression  Pt admitted secondary to problem above with deficits below. Pt requiring gross min guard A for mobility using RW. Pt with 1 LOB initially and required min A for steadying. Pt reports brother lives beside of him and reports his daughter can assist if needed. Recommend HHPT at d/c to ensure safety with mobility at home. Will continue to follow acutely to maximize functional mobility independence and safety.     Follow Up Recommendations Home health PT;Supervision for mobility/OOB    Equipment Recommendations  None recommended by PT    Recommendations for Other Services       Precautions / Restrictions Precautions Precautions: Fall;Other (comment) Precaution Comments: watch O2 sats Restrictions Weight Bearing Restrictions: No      Mobility  Bed Mobility Overal bed mobility: Modified Independent                Transfers Overall transfer level: Needs assistance Equipment used: Rolling walker (2 wheeled) Transfers: Sit to/from Stand Sit to Stand: Min guard         General transfer comment: Min guard for safety. No LOB noted.   Ambulation/Gait Ambulation/Gait assistance: Min guard;Min assist Gait Distance (Feet): 120 Feet Assistive device: Rolling walker (2 wheeled) Gait Pattern/deviations: Step-through pattern;Decreased stride length;Wide base of support Gait velocity: Decreased   General Gait Details: Slow, mildly unsteady gait. One LOB initially requiring min A for steadying. Otherwise requiring min guard A for safety using RW. Educated about using RW at home to increase safety. Oxygen sats decreasing to 88% on 4L, however,  increased back to 90% on 4L with seated rest.   Stairs            Wheelchair Mobility    Modified Rankin (Stroke Patients Only)       Balance Overall balance assessment: Needs assistance Sitting-balance support: No upper extremity supported;Feet supported Sitting balance-Leahy Scale: Good     Standing balance support: Bilateral upper extremity supported;During functional activity Standing balance-Leahy Scale: Poor Standing balance comment: Reliant on BUE support                              Pertinent Vitals/Pain Pain Assessment: No/denies pain    Home Living Family/patient expects to be discharged to:: Private residence Living Arrangements: Alone Available Help at Discharge: Family;Available PRN/intermittently Type of Home: House Home Access: Level entry     Home Layout: One level Home Equipment: Cane - single point;Walker - 2 wheels;Shower seat - built in;Grab bars - tub/shower;Hand held shower head;Grab bars - toilet      Prior Function Level of Independence: Independent with assistive device(s)         Comments: Reports using cane vs RW for mobility      Hand Dominance        Extremity/Trunk Assessment   Upper Extremity Assessment Upper Extremity Assessment: Defer to OT evaluation    Lower Extremity Assessment Lower Extremity Assessment: Generalized weakness    Cervical / Trunk Assessment Cervical / Trunk Assessment: Normal  Communication   Communication: No difficulties  Cognition Arousal/Alertness: Awake/alert Behavior During Therapy: WFL for tasks assessed/performed Overall Cognitive Status: Within Functional  Limits for tasks assessed                                        General Comments      Exercises     Assessment/Plan    PT Assessment Patient needs continued PT services  PT Problem List Decreased strength;Decreased activity tolerance;Decreased balance;Decreased mobility       PT Treatment  Interventions DME instruction;Gait training;Functional mobility training;Therapeutic activities;Stair training;Therapeutic exercise;Balance training;Patient/family education    PT Goals (Current goals can be found in the Care Plan section)  Acute Rehab PT Goals Patient Stated Goal: to go home  PT Goal Formulation: With patient Time For Goal Achievement: 04/26/19 Potential to Achieve Goals: Good    Frequency Min 3X/week   Barriers to discharge Decreased caregiver support      Co-evaluation               AM-PAC PT "6 Clicks" Mobility  Outcome Measure Help needed turning from your back to your side while in a flat bed without using bedrails?: None Help needed moving from lying on your back to sitting on the side of a flat bed without using bedrails?: None Help needed moving to and from a bed to a chair (including a wheelchair)?: A Little Help needed standing up from a chair using your arms (e.g., wheelchair or bedside chair)?: A Little Help needed to walk in hospital room?: A Little Help needed climbing 3-5 steps with a railing? : A Lot 6 Click Score: 19    End of Session Equipment Utilized During Treatment: Oxygen Activity Tolerance: Patient tolerated treatment well Patient left: in bed;with call bell/phone within reach(sitting EOB ) Nurse Communication: Mobility status PT Visit Diagnosis: Unsteadiness on feet (R26.81);Muscle weakness (generalized) (M62.81)    Time: 2482-5003 PT Time Calculation (min) (ACUTE ONLY): 25 min   Charges:   PT Evaluation $PT Eval Low Complexity: 1 Low PT Treatments $Gait Training: 8-22 mins        Eddie Rivera, PT, DPT  Acute Rehabilitation Services  Pager: [phone] Office: [phone]   Eddie Rivera 04/12/2019, 12:55 PM

## 2019-04-12 NOTE — Progress Notes (Signed)
Request to IR for thoracentesis today.  Per radiology protocol COVID test is required within 72 hours of any aerosol generating procedure. Upon chart review patient's last COVID test 7/29, which was negative. I have placed an order for COVID test to be repeated today with plans to proceed with thoracentesis tomorrow once resulted.  Please call IR with questions or concerns.  Candiss Norse, PA-C

## 2019-04-12 NOTE — TOC Transition Note (Signed)
Transition of Care [facility]) - CM/SW Discharge Note   Patient Details  Name: Boubacar Lerette MRN: [id number] Date of Birth: 14-Feb-1946  [facility] of Care [facility]) CM/SW Contact:  Claudie Leach, RN Phone Number: 04/12/2019, 4:09 PM   Clinical Narrative:    Discussed DC plans with patient. Eliquis 30 day free card given and patient states he can fill 1-3 days' doses of new medications at [facility] until he can get to [place].  Scripts changed to [employer] in [employer]  Faxed facesheet, orders and therapy notes to [facility] ([phone]).    Discussed palliative services/grief counseling with patient.  Patient is agreeable.  Notes and facesheet faxed to [employer] at [phone].  New medications faxed to [place] [phone].  They will need to obtain prescriptions from [facility].   Daughter will transport patient home.  Patient will remind her to bring portable oxygen.    Final next level of care: [place] Barriers to Discharge: Continued Medical Work up   Patient Goals and CMS Choice Patient states their goals for this hospitalization and ongoing recovery are:: "to get back home" CMS Medicare.gov Compare Post Acute Care list provided to:: Patient Choice offered to / list presented to : Patient   Discharge Plan and Services In-house Referral: NA Discharge Planning Services: CM Consult Post Acute Care Choice: Home Health           HH Arranged: RN, Disease Management, PT [place] Agency: [place] Date [place]: 04/09/19 Time [place]: 1030 Representative spoke with at [place]: Tillie Rung

## 2019-04-14 LAB — GLUCOSE, CAPILLARY
Glucose-Capillary: 212 mg/dL — ABNORMAL HIGH (ref 70–99)
Glucose-Capillary: 152 mg/dL — ABNORMAL HIGH (ref 70–99)

## 2019-04-17 DIAGNOSIS — J9612 Chronic respiratory failure with hypercapnia: Secondary | ICD-10-CM | POA: Diagnosis not present

## 2019-04-17 DIAGNOSIS — C3492 Malignant neoplasm of unspecified part of left bronchus or lung: Secondary | ICD-10-CM | POA: Diagnosis not present

## 2019-04-17 DIAGNOSIS — I509 Heart failure, unspecified: Secondary | ICD-10-CM | POA: Diagnosis not present

## 2019-04-17 DIAGNOSIS — I4891 Unspecified atrial fibrillation: Secondary | ICD-10-CM | POA: Diagnosis not present

## 2019-04-17 DIAGNOSIS — Z6836 Body mass index (BMI) 36.0-36.9, adult: Secondary | ICD-10-CM | POA: Diagnosis not present

## 2019-04-17 DIAGNOSIS — I110 Hypertensive heart disease with heart failure: Secondary | ICD-10-CM | POA: Diagnosis not present

## 2019-04-17 DIAGNOSIS — K5090 Crohn's disease, unspecified, without complications: Secondary | ICD-10-CM | POA: Diagnosis not present

## 2019-04-17 DIAGNOSIS — J9611 Chronic respiratory failure with hypoxia: Secondary | ICD-10-CM | POA: Diagnosis not present

## 2019-04-17 DIAGNOSIS — E119 Type 2 diabetes mellitus without complications: Secondary | ICD-10-CM | POA: Diagnosis not present

## 2019-04-17 DIAGNOSIS — G4733 Obstructive sleep apnea (adult) (pediatric): Secondary | ICD-10-CM | POA: Diagnosis not present

## 2019-04-17 DIAGNOSIS — K219 Gastro-esophageal reflux disease without esophagitis: Secondary | ICD-10-CM | POA: Diagnosis not present

## 2019-04-17 DIAGNOSIS — J441 Chronic obstructive pulmonary disease with (acute) exacerbation: Secondary | ICD-10-CM | POA: Diagnosis not present

## 2019-04-18 DIAGNOSIS — G4733 Obstructive sleep apnea (adult) (pediatric): Secondary | ICD-10-CM | POA: Diagnosis not present

## 2019-04-18 DIAGNOSIS — J9611 Chronic respiratory failure with hypoxia: Secondary | ICD-10-CM | POA: Diagnosis not present

## 2019-04-18 DIAGNOSIS — I110 Hypertensive heart disease with heart failure: Secondary | ICD-10-CM | POA: Diagnosis not present

## 2019-04-18 DIAGNOSIS — C3492 Malignant neoplasm of unspecified part of left bronchus or lung: Secondary | ICD-10-CM | POA: Diagnosis not present

## 2019-04-18 DIAGNOSIS — E119 Type 2 diabetes mellitus without complications: Secondary | ICD-10-CM | POA: Diagnosis not present

## 2019-04-18 DIAGNOSIS — J9612 Chronic respiratory failure with hypercapnia: Secondary | ICD-10-CM | POA: Diagnosis not present

## 2019-04-18 DIAGNOSIS — K219 Gastro-esophageal reflux disease without esophagitis: Secondary | ICD-10-CM | POA: Diagnosis not present

## 2019-04-18 DIAGNOSIS — I509 Heart failure, unspecified: Secondary | ICD-10-CM | POA: Diagnosis not present

## 2019-04-18 DIAGNOSIS — Z6836 Body mass index (BMI) 36.0-36.9, adult: Secondary | ICD-10-CM | POA: Diagnosis not present

## 2019-04-18 DIAGNOSIS — K5090 Crohn's disease, unspecified, without complications: Secondary | ICD-10-CM | POA: Diagnosis not present

## 2019-04-18 DIAGNOSIS — I4891 Unspecified atrial fibrillation: Secondary | ICD-10-CM | POA: Diagnosis not present

## 2019-04-18 DIAGNOSIS — J441 Chronic obstructive pulmonary disease with (acute) exacerbation: Secondary | ICD-10-CM | POA: Diagnosis not present

## 2019-04-20 DIAGNOSIS — K5090 Crohn's disease, unspecified, without complications: Secondary | ICD-10-CM | POA: Diagnosis not present

## 2019-04-20 DIAGNOSIS — E119 Type 2 diabetes mellitus without complications: Secondary | ICD-10-CM | POA: Diagnosis not present

## 2019-04-20 DIAGNOSIS — I110 Hypertensive heart disease with heart failure: Secondary | ICD-10-CM | POA: Diagnosis not present

## 2019-04-20 DIAGNOSIS — I4891 Unspecified atrial fibrillation: Secondary | ICD-10-CM | POA: Diagnosis not present

## 2019-04-20 DIAGNOSIS — Z6836 Body mass index (BMI) 36.0-36.9, adult: Secondary | ICD-10-CM | POA: Diagnosis not present

## 2019-04-20 DIAGNOSIS — J9611 Chronic respiratory failure with hypoxia: Secondary | ICD-10-CM | POA: Diagnosis not present

## 2019-04-20 DIAGNOSIS — J441 Chronic obstructive pulmonary disease with (acute) exacerbation: Secondary | ICD-10-CM | POA: Diagnosis not present

## 2019-04-20 DIAGNOSIS — J9612 Chronic respiratory failure with hypercapnia: Secondary | ICD-10-CM | POA: Diagnosis not present

## 2019-04-20 DIAGNOSIS — G4733 Obstructive sleep apnea (adult) (pediatric): Secondary | ICD-10-CM | POA: Diagnosis not present

## 2019-04-20 DIAGNOSIS — C3492 Malignant neoplasm of unspecified part of left bronchus or lung: Secondary | ICD-10-CM | POA: Diagnosis not present

## 2019-04-20 DIAGNOSIS — K219 Gastro-esophageal reflux disease without esophagitis: Secondary | ICD-10-CM | POA: Diagnosis not present

## 2019-04-20 DIAGNOSIS — I509 Heart failure, unspecified: Secondary | ICD-10-CM | POA: Diagnosis not present

## 2019-04-24 DIAGNOSIS — I509 Heart failure, unspecified: Secondary | ICD-10-CM | POA: Diagnosis not present

## 2019-04-24 DIAGNOSIS — J441 Chronic obstructive pulmonary disease with (acute) exacerbation: Secondary | ICD-10-CM | POA: Diagnosis not present

## 2019-04-24 DIAGNOSIS — K219 Gastro-esophageal reflux disease without esophagitis: Secondary | ICD-10-CM | POA: Diagnosis not present

## 2019-04-24 DIAGNOSIS — I110 Hypertensive heart disease with heart failure: Secondary | ICD-10-CM | POA: Diagnosis not present

## 2019-04-24 DIAGNOSIS — E119 Type 2 diabetes mellitus without complications: Secondary | ICD-10-CM | POA: Diagnosis not present

## 2019-04-24 DIAGNOSIS — I4891 Unspecified atrial fibrillation: Secondary | ICD-10-CM | POA: Diagnosis not present

## 2019-04-24 DIAGNOSIS — J9611 Chronic respiratory failure with hypoxia: Secondary | ICD-10-CM | POA: Diagnosis not present

## 2019-04-24 DIAGNOSIS — K5090 Crohn's disease, unspecified, without complications: Secondary | ICD-10-CM | POA: Diagnosis not present

## 2019-04-24 DIAGNOSIS — C3492 Malignant neoplasm of unspecified part of left bronchus or lung: Secondary | ICD-10-CM | POA: Diagnosis not present

## 2019-04-24 DIAGNOSIS — G4733 Obstructive sleep apnea (adult) (pediatric): Secondary | ICD-10-CM | POA: Diagnosis not present

## 2019-04-24 DIAGNOSIS — Z6836 Body mass index (BMI) 36.0-36.9, adult: Secondary | ICD-10-CM | POA: Diagnosis not present

## 2019-04-24 DIAGNOSIS — J9612 Chronic respiratory failure with hypercapnia: Secondary | ICD-10-CM | POA: Diagnosis not present

## 2019-04-29 DIAGNOSIS — I110 Hypertensive heart disease with heart failure: Secondary | ICD-10-CM | POA: Diagnosis not present

## 2019-04-29 DIAGNOSIS — C3492 Malignant neoplasm of unspecified part of left bronchus or lung: Secondary | ICD-10-CM | POA: Diagnosis not present

## 2019-04-29 DIAGNOSIS — E119 Type 2 diabetes mellitus without complications: Secondary | ICD-10-CM | POA: Diagnosis not present

## 2019-04-29 DIAGNOSIS — I4891 Unspecified atrial fibrillation: Secondary | ICD-10-CM | POA: Diagnosis not present

## 2019-04-29 DIAGNOSIS — J9611 Chronic respiratory failure with hypoxia: Secondary | ICD-10-CM | POA: Diagnosis not present

## 2019-04-29 DIAGNOSIS — K219 Gastro-esophageal reflux disease without esophagitis: Secondary | ICD-10-CM | POA: Diagnosis not present

## 2019-04-29 DIAGNOSIS — Z6836 Body mass index (BMI) 36.0-36.9, adult: Secondary | ICD-10-CM | POA: Diagnosis not present

## 2019-04-29 DIAGNOSIS — I509 Heart failure, unspecified: Secondary | ICD-10-CM | POA: Diagnosis not present

## 2019-04-29 DIAGNOSIS — J9612 Chronic respiratory failure with hypercapnia: Secondary | ICD-10-CM | POA: Diagnosis not present

## 2019-04-29 DIAGNOSIS — G4733 Obstructive sleep apnea (adult) (pediatric): Secondary | ICD-10-CM | POA: Diagnosis not present

## 2019-04-29 DIAGNOSIS — K5090 Crohn's disease, unspecified, without complications: Secondary | ICD-10-CM | POA: Diagnosis not present

## 2019-04-29 DIAGNOSIS — J441 Chronic obstructive pulmonary disease with (acute) exacerbation: Secondary | ICD-10-CM | POA: Diagnosis not present

## 2019-08-24 DIAGNOSIS — M25561 Pain in right knee: Secondary | ICD-10-CM | POA: Diagnosis not present

## 2019-09-08 DIAGNOSIS — L820 Inflamed seborrheic keratosis: Secondary | ICD-10-CM | POA: Diagnosis not present

## 2019-09-08 DIAGNOSIS — B359 Dermatophytosis, unspecified: Secondary | ICD-10-CM | POA: Diagnosis not present

## 2019-09-08 DIAGNOSIS — L299 Pruritus, unspecified: Secondary | ICD-10-CM | POA: Diagnosis not present

## 2019-09-08 DIAGNOSIS — L309 Dermatitis, unspecified: Secondary | ICD-10-CM | POA: Diagnosis not present

## 2019-09-08 DIAGNOSIS — D485 Neoplasm of uncertain behavior of skin: Secondary | ICD-10-CM | POA: Diagnosis not present

## 2020-02-13 DIAGNOSIS — J029 Acute pharyngitis, unspecified: Secondary | ICD-10-CM | POA: Diagnosis not present

## 2020-07-06 DIAGNOSIS — G5783 Other specified mononeuropathies of bilateral lower limbs: Secondary | ICD-10-CM | POA: Diagnosis not present

## 2020-07-13 DIAGNOSIS — B353 Tinea pedis: Secondary | ICD-10-CM | POA: Diagnosis not present

## 2020-07-13 DIAGNOSIS — B354 Tinea corporis: Secondary | ICD-10-CM | POA: Diagnosis not present

## 2020-07-13 DIAGNOSIS — B351 Tinea unguium: Secondary | ICD-10-CM | POA: Diagnosis not present

## 2020-08-28 IMAGING — DX PORTABLE CHEST - 1 VIEW
1 series · 1 of 1 positions shown · non-contrast
Comparison: Radiograph same day.

CLINICAL DATA: Respiratory failure.

EXAM:
PORTABLE CHEST 1 VIEW

[chest ap]
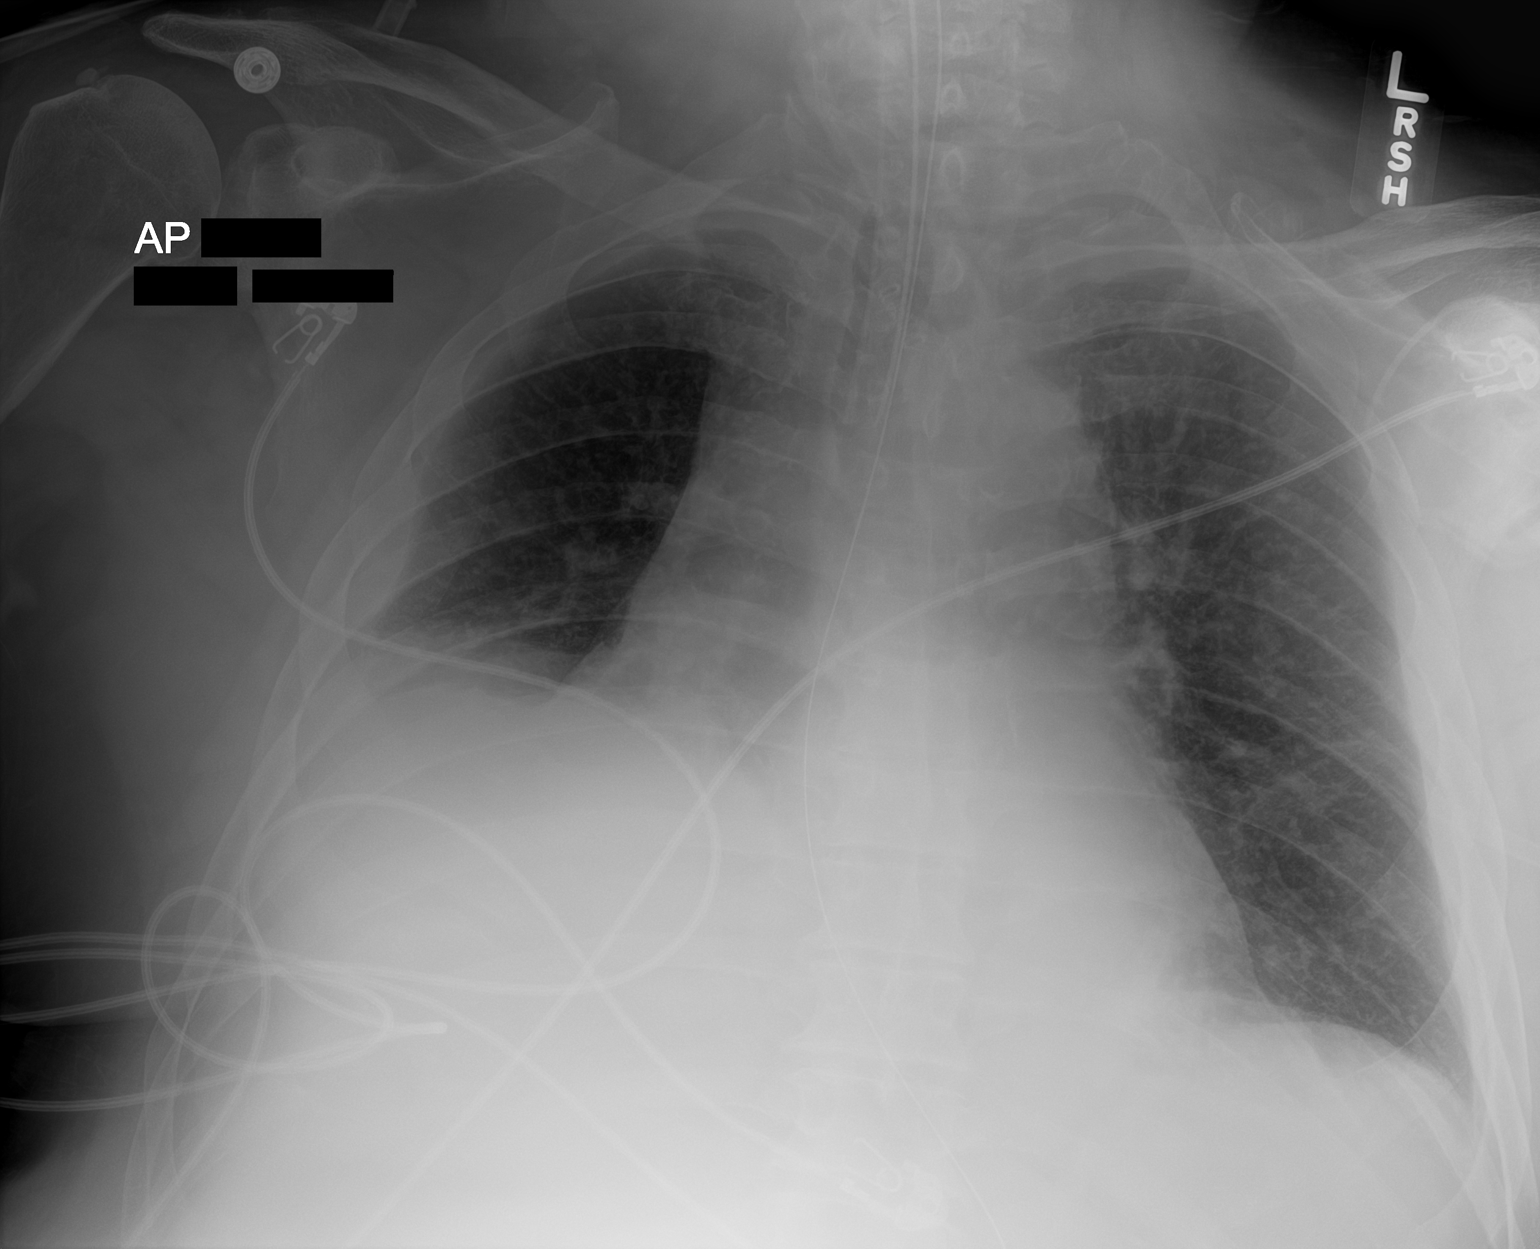

[1 of 1 positions shown; findings below may reference images not displayed]

FINDINGS: Stable cardiomediastinal silhouette. Endotracheal and nasogastric
tubes are in good position. No pneumothorax is noted. Left lung is
clear. Stable right basilar atelectasis is noted with small right
pleural effusion. Bony thorax unremarkable.
IMPRESSION: Stable support apparatus. Stable right basilar subsegmental
atelectasis is noted with small right pleural effusion.

## 2020-09-05 IMAGING — DX PORTABLE CHEST - 1 VIEW
1 series · 1 of 1 positions shown · non-contrast
Comparison: April 08, 2019

CLINICAL DATA: 73-year-old male with a history of shortness of
breath

EXAM:
PORTABLE CHEST 1 VIEW

[chest ap]
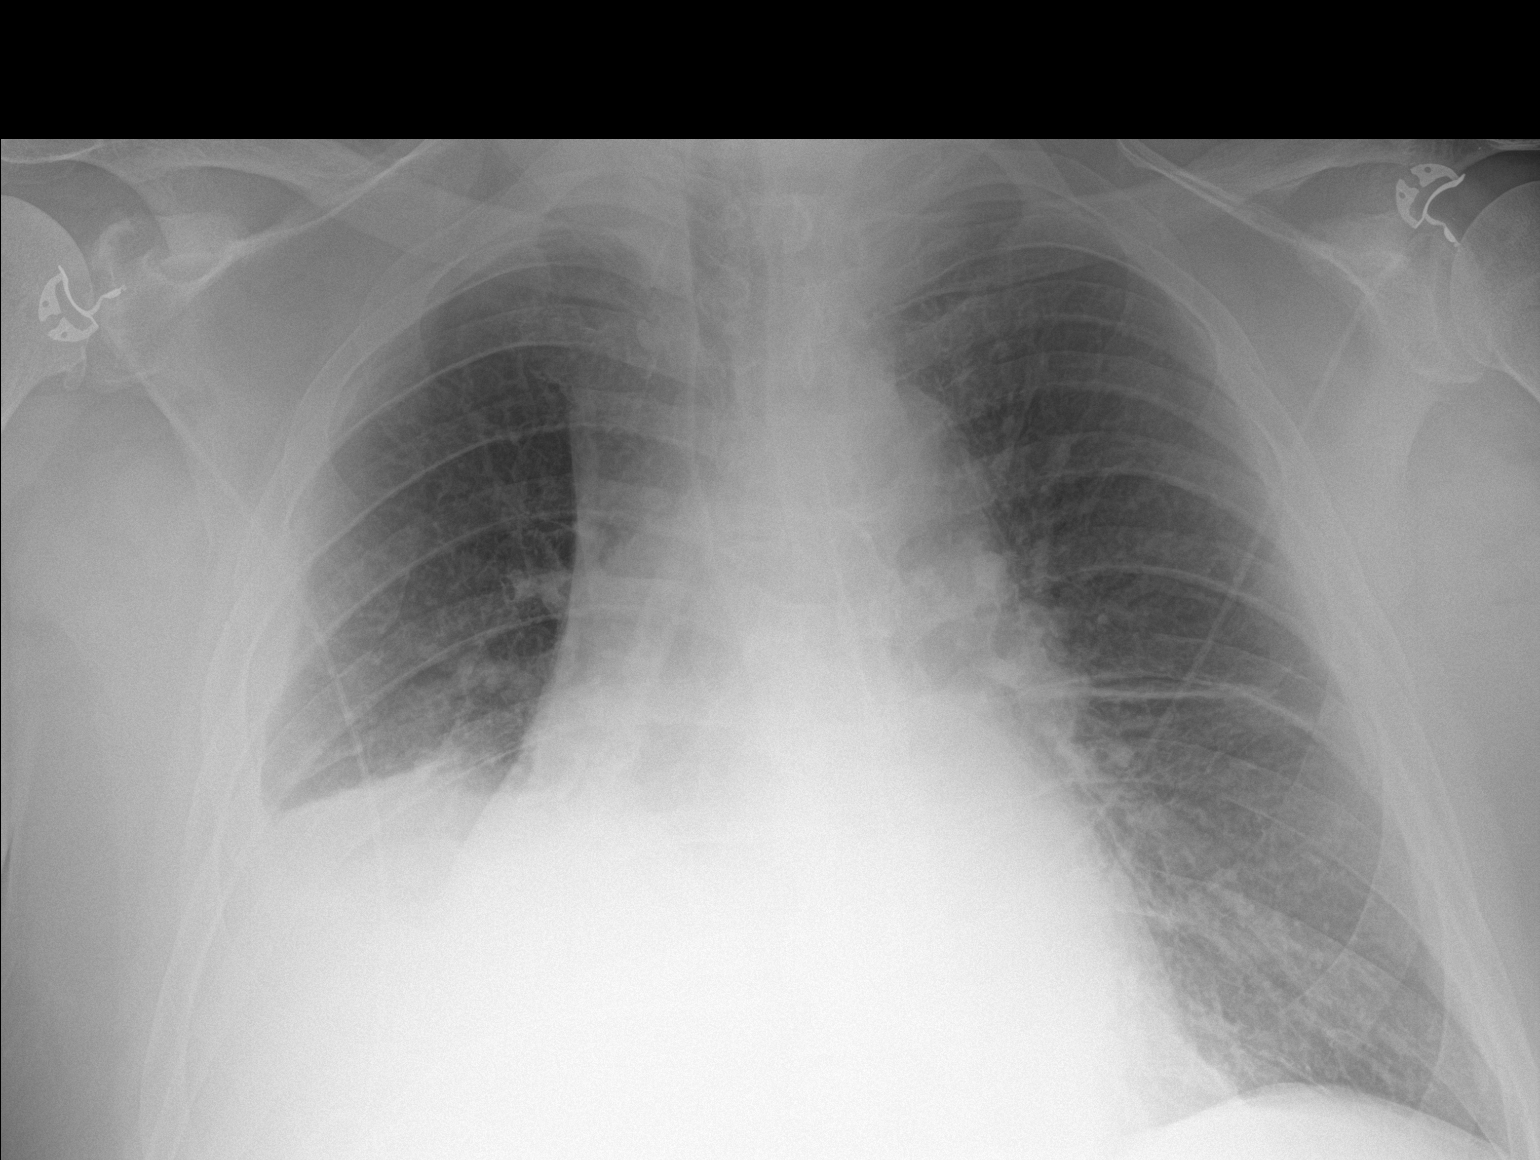

[1 of 1 positions shown; findings below may reference images not displayed]

FINDINGS: Cardiomediastinal silhouette unchanged in size and contour. No
interlobular septal thickening.

No pneumothorax.

Similar appearance of right basilar opacity with obscuration of the
right hemidiaphragm and the right heart border. Pleuroparenchymal
thickening along the periphery.

Linear opacity at the left base persists.

No displaced fracture.
IMPRESSION: Unchanged right basilar opacity likely a combination of
atelectasis/consolidation and pleural effusion.

## 2020-09-06 IMAGING — DX PORTABLE CHEST - 1 VIEW
1 series · 1 of 1 positions shown · non-contrast
Comparison: Chest radiograph 04/10/2019

CLINICAL DATA: Respiratory failure

EXAM:
PORTABLE CHEST 1 VIEW

[chest ap]
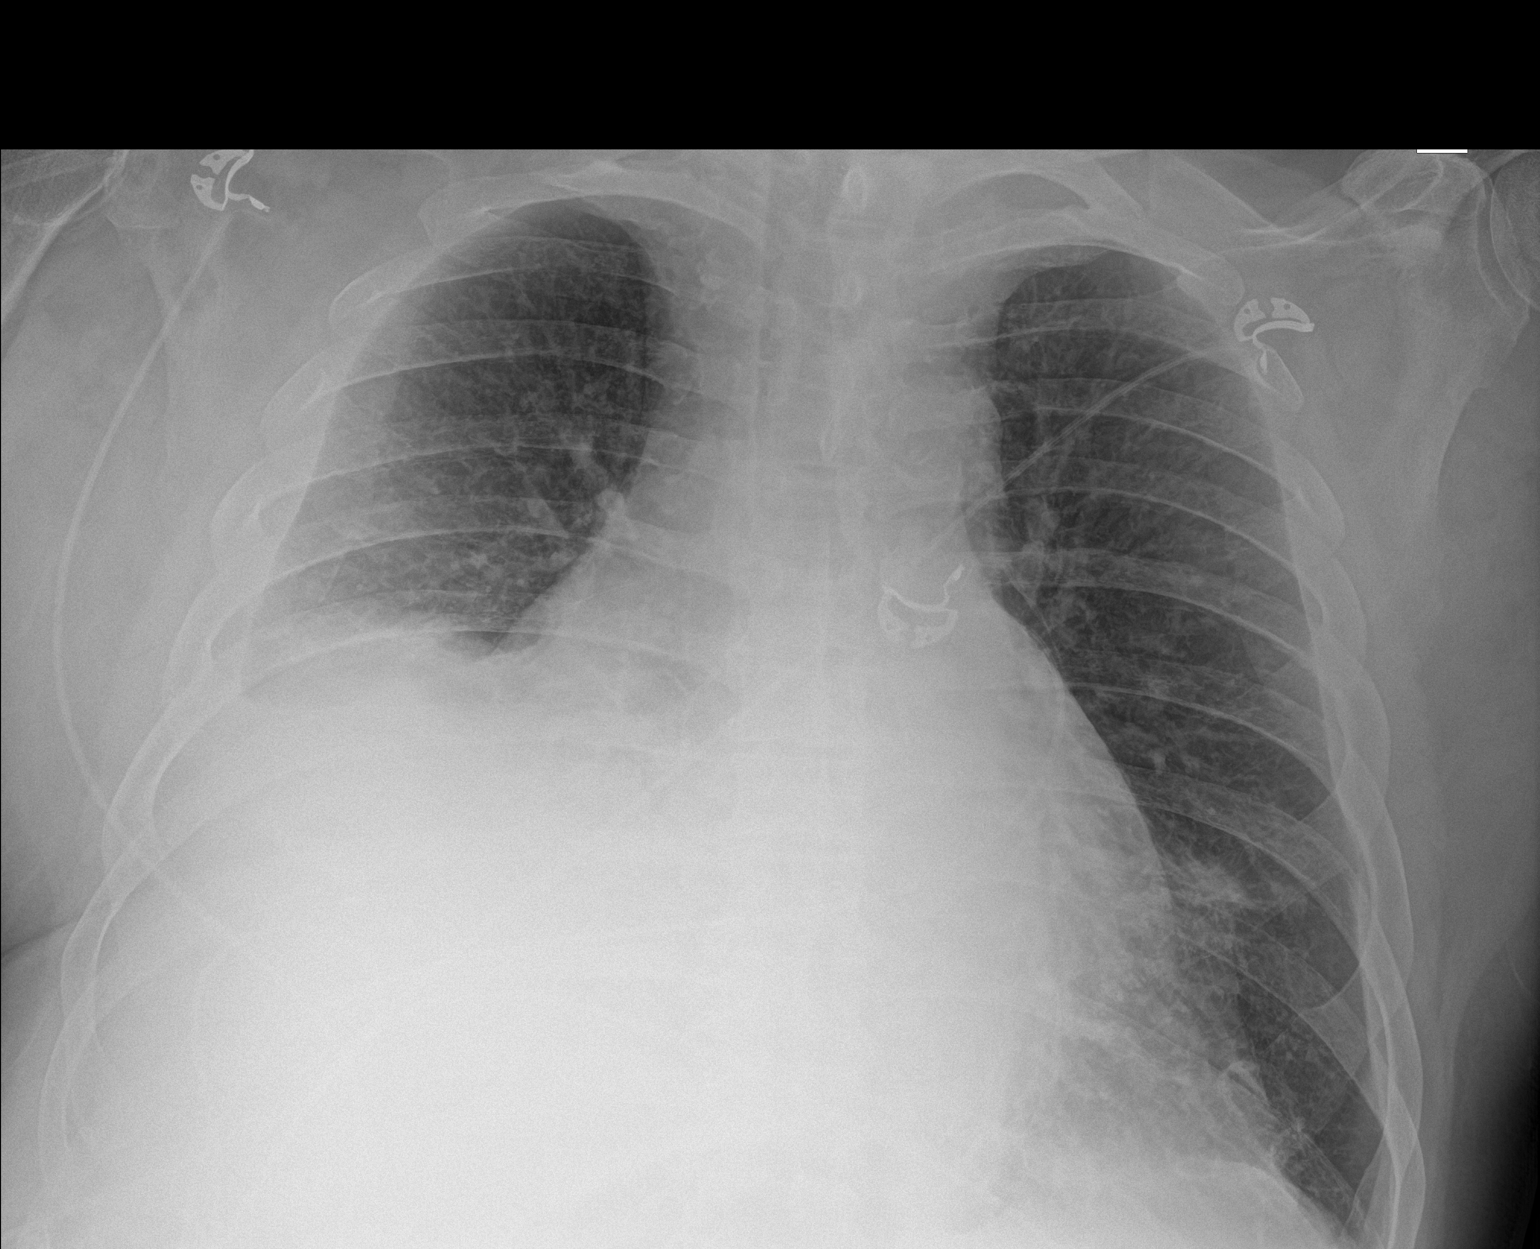

[1 of 1 positions shown; findings below may reference images not displayed]

FINDINGS: Monitoring leads overlie the patient. Stable cardiomegaly.
Persistent elevation right hemidiaphragm. Small right pleural
effusion and right lung base consolidation. Mild left basilar
atelectasis. No pneumothorax.
IMPRESSION: Persistent small right pleural effusion and underlying consolidation
which may represent atelectasis or infection.

## 2020-09-15 DIAGNOSIS — R0782 Intercostal pain: Secondary | ICD-10-CM | POA: Diagnosis not present

## 2020-09-15 DIAGNOSIS — J441 Chronic obstructive pulmonary disease with (acute) exacerbation: Secondary | ICD-10-CM | POA: Diagnosis not present

## 2020-09-15 DIAGNOSIS — R051 Acute cough: Secondary | ICD-10-CM | POA: Diagnosis not present

## 2020-09-15 DIAGNOSIS — J0100 Acute maxillary sinusitis, unspecified: Secondary | ICD-10-CM | POA: Diagnosis not present

## 2020-09-15 DIAGNOSIS — Z20828 Contact with and (suspected) exposure to other viral communicable diseases: Secondary | ICD-10-CM | POA: Diagnosis not present

## 2020-09-29 DIAGNOSIS — Z87891 Personal history of nicotine dependence: Secondary | ICD-10-CM | POA: Diagnosis not present

## 2020-09-29 DIAGNOSIS — R0602 Shortness of breath: Secondary | ICD-10-CM | POA: Diagnosis not present

## 2020-09-29 DIAGNOSIS — J9611 Chronic respiratory failure with hypoxia: Secondary | ICD-10-CM | POA: Diagnosis not present

## 2020-09-29 DIAGNOSIS — G4733 Obstructive sleep apnea (adult) (pediatric): Secondary | ICD-10-CM | POA: Diagnosis not present

## 2020-09-29 DIAGNOSIS — K219 Gastro-esophageal reflux disease without esophagitis: Secondary | ICD-10-CM | POA: Diagnosis not present

## 2020-09-29 DIAGNOSIS — R0902 Hypoxemia: Secondary | ICD-10-CM | POA: Diagnosis not present

## 2020-09-29 DIAGNOSIS — I4510 Unspecified right bundle-branch block: Secondary | ICD-10-CM | POA: Diagnosis not present

## 2020-09-29 DIAGNOSIS — I10 Essential (primary) hypertension: Secondary | ICD-10-CM | POA: Diagnosis not present

## 2020-09-29 DIAGNOSIS — I4891 Unspecified atrial fibrillation: Secondary | ICD-10-CM | POA: Diagnosis not present

## 2020-09-29 DIAGNOSIS — R42 Dizziness and giddiness: Secondary | ICD-10-CM | POA: Diagnosis not present

## 2020-09-29 DIAGNOSIS — E119 Type 2 diabetes mellitus without complications: Secondary | ICD-10-CM | POA: Diagnosis not present

## 2020-09-29 DIAGNOSIS — E785 Hyperlipidemia, unspecified: Secondary | ICD-10-CM | POA: Diagnosis not present

## 2020-09-29 DIAGNOSIS — Z9119 Patient's noncompliance with other medical treatment and regimen: Secondary | ICD-10-CM | POA: Diagnosis not present

## 2020-09-29 DIAGNOSIS — Z9981 Dependence on supplemental oxygen: Secondary | ICD-10-CM | POA: Diagnosis not present

## 2020-09-29 DIAGNOSIS — I5022 Chronic systolic (congestive) heart failure: Secondary | ICD-10-CM | POA: Diagnosis not present

## 2020-09-29 DIAGNOSIS — Z20822 Contact with and (suspected) exposure to covid-19: Secondary | ICD-10-CM | POA: Diagnosis not present

## 2020-09-29 DIAGNOSIS — J441 Chronic obstructive pulmonary disease with (acute) exacerbation: Secondary | ICD-10-CM | POA: Diagnosis not present

## 2020-09-29 DIAGNOSIS — Z7901 Long term (current) use of anticoagulants: Secondary | ICD-10-CM | POA: Diagnosis not present

## 2020-09-29 DIAGNOSIS — Z85118 Personal history of other malignant neoplasm of bronchus and lung: Secondary | ICD-10-CM | POA: Diagnosis not present

## 2020-09-29 DIAGNOSIS — I110 Hypertensive heart disease with heart failure: Secondary | ICD-10-CM | POA: Diagnosis not present

## 2020-09-29 DIAGNOSIS — J9612 Chronic respiratory failure with hypercapnia: Secondary | ICD-10-CM | POA: Diagnosis not present

## 2021-06-01 DIAGNOSIS — L578 Other skin changes due to chronic exposure to nonionizing radiation: Secondary | ICD-10-CM | POA: Diagnosis not present

## 2021-06-01 DIAGNOSIS — L820 Inflamed seborrheic keratosis: Secondary | ICD-10-CM | POA: Diagnosis not present

## 2021-06-01 DIAGNOSIS — B351 Tinea unguium: Secondary | ICD-10-CM | POA: Diagnosis not present

## 2021-06-01 DIAGNOSIS — B353 Tinea pedis: Secondary | ICD-10-CM | POA: Diagnosis not present

## 2021-06-15 DIAGNOSIS — R0981 Nasal congestion: Secondary | ICD-10-CM | POA: Diagnosis not present

## 2021-06-15 DIAGNOSIS — L03113 Cellulitis of right upper limb: Secondary | ICD-10-CM | POA: Diagnosis not present

## 2021-06-15 DIAGNOSIS — R051 Acute cough: Secondary | ICD-10-CM | POA: Diagnosis not present

## 2021-06-15 DIAGNOSIS — J209 Acute bronchitis, unspecified: Secondary | ICD-10-CM | POA: Diagnosis not present

## 2021-06-15 DIAGNOSIS — Z20828 Contact with and (suspected) exposure to other viral communicable diseases: Secondary | ICD-10-CM | POA: Diagnosis not present

## 2021-06-28 DIAGNOSIS — H0012 Chalazion right lower eyelid: Secondary | ICD-10-CM | POA: Diagnosis not present

## 2021-07-11 DIAGNOSIS — H02889 Meibomian gland dysfunction of unspecified eye, unspecified eyelid: Secondary | ICD-10-CM | POA: Diagnosis not present

## 2021-07-11 DIAGNOSIS — L821 Other seborrheic keratosis: Secondary | ICD-10-CM | POA: Diagnosis not present

## 2021-07-11 DIAGNOSIS — H04201 Unspecified epiphora, right lacrimal gland: Secondary | ICD-10-CM | POA: Diagnosis not present

## 2021-07-11 DIAGNOSIS — H0259 Other disorders affecting eyelid function: Secondary | ICD-10-CM | POA: Diagnosis not present

## 2021-07-11 DIAGNOSIS — D485 Neoplasm of uncertain behavior of skin: Secondary | ICD-10-CM | POA: Diagnosis not present

## 2023-06-11 DEATH — deceased
